# Patient Record
Sex: Male | Born: 1940 | Race: White | Hispanic: No | Marital: Married | Smoking: Former smoker
Health system: Southern US, Community
[De-identification: ages and names within clinical notes are randomized; demographics above are authoritative.]

## PROBLEM LIST (undated history)

## (undated) DIAGNOSIS — I2510 Atherosclerotic heart disease of native coronary artery without angina pectoris: Secondary | ICD-10-CM

## (undated) DIAGNOSIS — C801 Malignant (primary) neoplasm, unspecified: Secondary | ICD-10-CM

## (undated) DIAGNOSIS — I442 Atrioventricular block, complete: Secondary | ICD-10-CM

## (undated) DIAGNOSIS — I10 Essential (primary) hypertension: Secondary | ICD-10-CM

## (undated) HISTORY — PX: CORONARY ARTERY BYPASS GRAFT: SHX141

## (undated) HISTORY — PX: ANKLE SURGERY: SHX546

## (undated) HISTORY — PX: CARDIAC CATHETERIZATION: SHX172

## (undated) HISTORY — PX: EYE SURGERY: SHX253

## (undated) HISTORY — PX: INSERTION PROSTATE RADIATION SEED: SUR718

## (undated) HISTORY — PX: COLONOSCOPY: SHX174

---

## 2008-04-29 ENCOUNTER — Ambulatory Visit: Admission: RE | Admit: 2008-04-29 | Discharge: 2008-07-28 | Payer: Self-pay | Admitting: Radiation Oncology

## 2008-05-27 ENCOUNTER — Encounter: Admission: RE | Admit: 2008-05-27 | Discharge: 2008-05-27 | Payer: Self-pay | Admitting: Urology

## 2008-06-13 ENCOUNTER — Ambulatory Visit (HOSPITAL_BASED_OUTPATIENT_CLINIC_OR_DEPARTMENT_OTHER): Admission: RE | Admit: 2008-06-13 | Discharge: 2008-06-13 | Payer: Self-pay | Admitting: Urology

## 2010-12-31 ENCOUNTER — Emergency Department (HOSPITAL_COMMUNITY)
Admission: EM | Admit: 2010-12-31 | Discharge: 2010-12-31 | Disposition: A | Discharge status: Home / Self Care | Payer: Medicare Other | Attending: Emergency Medicine | Admitting: Emergency Medicine

## 2010-12-31 DIAGNOSIS — N509 Disorder of male genital organs, unspecified: Secondary | ICD-10-CM | POA: Insufficient documentation

## 2010-12-31 DIAGNOSIS — X58XXXA Exposure to other specified factors, initial encounter: Secondary | ICD-10-CM | POA: Insufficient documentation

## 2010-12-31 DIAGNOSIS — IMO0002 Reserved for concepts with insufficient information to code with codable children: Secondary | ICD-10-CM | POA: Insufficient documentation

## 2011-03-01 NOTE — Op Note (Signed)
NAME:  KASSIM, GUERTIN                  ACCOUNT NO.:  [id number]   MEDICAL RECORD NO.:  [id number]          PATIENT TYPE:  AMB   LOCATION:  [facility]                         FACILITY:  [facility]   PHYSICIAN:  Excell Seltzer. Annabell Howells, M.D.    DATE OF BIRTH:  1941-02-07   DATE OF PROCEDURE:  06/13/2008  DATE OF DISCHARGE:                               OPERATIVE REPORT   PROCEDURE:  Prostate seed implantation at cystoscopy.   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   SURGEON:  Dr. Bjorn Pippin.   RADIATION ONCOLOGIST:  Margaretmary Dys.   ANESTHESIA:  General.   DRAINS:  Foley catheter.   COMPLICATIONS:  None.   INDICATIONS:  Quanta is a 70 year old white male with a Gleason 6 T1c/T2a  adenocarcinoma of the prostate with a PSA of 9.37 who has elected to  undergo seed implantation.   FINDINGS OF PROCEDURE:  He was given Cipro.  He was taken to the  operating room where general anesthetic was induced.  He was placed in  lithotomy position.  His genitalia was prepped, and a Foley catheter was  inserted.  Balloon was filled with dilute contrast.  The perineum was  shaved.  Red rubber rectal catheter was placed.  The ultrasound probe  was assembled and inserted, and the Nucletron device was used to do real-  time planning.   Once the real-time plan had been completed, the perineum was prepped  with Betadine solution.  The needle grid was placed, and stabilization  needles were inserted.  The patient was draped in usual sterile fashion.  He then underwent implantation of 82, 125 seeds via 23 needles without  complications.   After completion of the procedure, fluoroscopy revealed a good  distribution of seeds.  There was 1 seed that escaped laterally on the  right, likely in a vein.   The Foley catheter was removed.  The genitalia was reprepped.  Cystoscopy was performed with a 16-French flexible scope.  Examination  revealed a normal urethra.  The external sphincter  was intact.  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  Examination of bladder revealed mild trabeculation.  No tumor, stones or  inflammation were noted.  No seeds were noted in the bladder or the  urethra.  There was minimal bleeding.   At this point, Foley catheter was reinserted.  The patient was  reinserted and placed to straight drainage.  The patient was taken down  from the lithotomy position after his perineum was cleaned and dressed.  His general anesthetic was reversed.  He was taken to recovery room in  stable condition.  There were no complications.      Excell Seltzer. Annabell Howells, M.D.  Electronically Signed     JJW/MEDQ  D:  06/13/2008  T:  06/13/2008  Job:  [id number]   cc:   Artist Pais Kathrynn Running, M.D.  Fax: [phone]

## 2012-06-24 ENCOUNTER — Emergency Department (HOSPITAL_COMMUNITY)
Admission: EM | Admit: 2012-06-24 | Discharge: 2012-06-24 | Disposition: A | Discharge status: Home / Self Care | Payer: Medicare Other | Attending: Emergency Medicine | Admitting: Emergency Medicine

## 2012-06-24 ENCOUNTER — Encounter (HOSPITAL_COMMUNITY): Payer: Self-pay | Admitting: Emergency Medicine

## 2012-06-24 DIAGNOSIS — Z7982 Long term (current) use of aspirin: Secondary | ICD-10-CM | POA: Insufficient documentation

## 2012-06-24 DIAGNOSIS — N390 Urinary tract infection, site not specified: Secondary | ICD-10-CM | POA: Insufficient documentation

## 2012-06-24 DIAGNOSIS — I2510 Atherosclerotic heart disease of native coronary artery without angina pectoris: Secondary | ICD-10-CM | POA: Insufficient documentation

## 2012-06-24 DIAGNOSIS — Z87891 Personal history of nicotine dependence: Secondary | ICD-10-CM | POA: Insufficient documentation

## 2012-06-24 DIAGNOSIS — Z859 Personal history of malignant neoplasm, unspecified: Secondary | ICD-10-CM | POA: Insufficient documentation

## 2012-06-24 DIAGNOSIS — I10 Essential (primary) hypertension: Secondary | ICD-10-CM | POA: Insufficient documentation

## 2012-06-24 DIAGNOSIS — R339 Retention of urine, unspecified: Secondary | ICD-10-CM | POA: Insufficient documentation

## 2012-06-24 HISTORY — DX: Atherosclerotic heart disease of native coronary artery without angina pectoris: I25.10

## 2012-06-24 HISTORY — DX: Essential (primary) hypertension: I10

## 2012-06-24 HISTORY — DX: Malignant (primary) neoplasm, unspecified: C80.1

## 2012-06-24 LAB — URINALYSIS, ROUTINE W REFLEX MICROSCOPIC
Ketones, ur: NEGATIVE mg/dL
Nitrite: NEGATIVE
Specific Gravity, Urine: 1.023 (ref 1.005–1.030)
Urobilinogen, UA: 1 mg/dL (ref 0.0–1.0)
pH: 6.5 (ref 5.0–8.0)

## 2012-06-24 LAB — URINE MICROSCOPIC-ADD ON

## 2012-06-24 MED ORDER — SULFAMETHOXAZOLE-TRIMETHOPRIM 800-160 MG PO TABS: 1.0000 | ORAL_TABLET | Freq: Two times a day (BID) | ORAL | Status: AC

## 2012-06-24 NOTE — ED Notes (Signed)
Pt c/o urinary retention, last urination last night about 9pm. Pt c/o passing bright red blood. Denies pain.

## 2012-06-24 NOTE — ED Provider Notes (Signed)
History     CSN: [id number]  Arrival date & time 06/24/12  0701   First MD Initiated Contact with Patient 06/24/12 [phone]      Chief Complaint  Patient presents with  . Urinary Retention    (Consider location/radiation/quality/duration/timing/severity/associated sxs/prior treatment) The history is provided by the patient.   patient states that he passed a little ago but blood clot last night with urination. He states since then his past bright red blood without being able to urinate otherwise. He states he feels as if he has to go. His history of prostate cancer that is well controlled and has had radiation seeds. No fever. He's been feeling well otherwise. No other bleeding. Dr. Annabell Howells is  his urologist. His PSAs have been normal.  Past Medical History  Diagnosis Date  . Hypertension   . Cancer prostate    with seeds  . Coronary artery disease     Past Surgical History  Procedure Date  . Insertion prostate radiation seed   . Coronary artery bypass graft   . Ankle surgery     No family history on file.  History  Substance Use Topics  . Smoking status: Former Games developer  . Smokeless tobacco: Not on file  . Alcohol Use: Yes     occasional      Review of Systems  Constitutional: Negative for appetite change and fatigue.  Eyes: Negative for pain.  Respiratory: Negative for choking.   Cardiovascular: Negative for chest pain.  Gastrointestinal: Negative for abdominal pain.  Genitourinary: Positive for hematuria, decreased urine volume and difficulty urinating. Negative for discharge, penile swelling, penile pain and testicular pain.  Musculoskeletal: Negative for back pain.  Neurological: Negative for light-headedness.  Hematological: Does not bruise/bleed easily.    Allergies  Review of patient's allergies indicates no known allergies.  Home Medications   Current Outpatient Rx  Name Route Sig Dispense Refill  . AMLODIPINE BESYLATE 5 MG PO TABS Oral Take 5 mg by  mouth daily.    . ASPIRIN 81 MG PO CHEW Oral Chew 81 mg by mouth daily.    Marland Kitchen BIMATOPROST 0.01 % OP SOLN Both Eyes Place 1 drop into both eyes at bedtime.    Marland Kitchen HYDROCHLOROTHIAZIDE 25 MG PO TABS Oral Take 25 mg by mouth daily.    Marland Kitchen METOPROLOL SUCCINATE ER 100 MG PO TB24 Oral Take 100 mg by mouth daily. Take with or immediately following a meal.    . SIMVASTATIN 20 MG PO TABS Oral Take 20 mg by mouth every evening.    Marland Kitchen VALSARTAN 320 MG PO TABS Oral Take 320 mg by mouth daily.    . SULFAMETHOXAZOLE-TRIMETHOPRIM 800-160 MG PO TABS Oral Take 1 tablet by mouth every 12 (twelve) hours. 28 tablet 0    BP 137/87  Pulse 58  Temp 98.2 F (36.8 C) (Oral)  Resp 18  Ht 5\' 9"  (1.753 m)  Wt 185 lb (83.915 kg)  BMI 27.32 kg/m2  SpO2 97%  Physical Exam  Constitutional: He is oriented to person, place, and time. He appears well-developed and well-nourished.  HENT:  Head: Normocephalic.  Eyes: Pupils are equal, round, and reactive to light.  Neck: Normal range of motion.  Cardiovascular: Normal rate and regular rhythm.   Pulmonary/Chest: Effort normal and breath sounds normal.  Abdominal: He exhibits mass. There is tenderness.  Genitourinary: Penis normal. No penile tenderness.  Musculoskeletal: Normal range of motion.  Neurological: He is alert and oriented to person, place, and time.  Skin:  No rash noted.    ED Course  Procedures (including critical care time)  Labs Reviewed  URINALYSIS, ROUTINE W REFLEX MICROSCOPIC - Abnormal; Notable for the following:    Color, Urine RED (*)  BIOCHEMICALS MAY BE AFFECTED BY COLOR   APPearance CLOUDY (*)     Hgb urine dipstick LARGE (*)     Protein, ur 100 (*)     Leukocytes, UA SMALL (*)     All other components within normal limits  URINE MICROSCOPIC-ADD ON - Abnormal; Notable for the following:    Bacteria, UA MANY (*)     All other components within normal limits  URINE CULTURE   No results found.   1. Urinary retention   2. UTI (urinary  tract infection)       MDM  Patient with urinary retention. Improve the Foley catheter. He has apparent UTI on UA. Urine culture has been sent. After discussion with Dr. Vernie Ammons  Patient was started on bactrim and will follow with Dr Annabell Howells.   Juliet Rude. Rubin Payor, MD 06/24/12 [phone]

## 2012-06-24 NOTE — ED Notes (Signed)
[id number]<AV> Expected date:<BR> Expected time:<BR> Means of arrival:<BR> Comments:<BR> t1

## 2012-06-25 LAB — URINE CULTURE: Colony Count: NO GROWTH

## 2013-06-28 ENCOUNTER — Emergency Department (HOSPITAL_COMMUNITY): Payer: Medicare Other

## 2013-06-28 ENCOUNTER — Encounter (HOSPITAL_COMMUNITY): Payer: Self-pay | Admitting: Emergency Medicine

## 2013-06-28 ENCOUNTER — Emergency Department (HOSPITAL_COMMUNITY)
Admission: EM | Admit: 2013-06-28 | Discharge: 2013-06-28 | Disposition: A | Discharge status: Home / Self Care | Payer: Medicare Other | Attending: Emergency Medicine | Admitting: Emergency Medicine

## 2013-06-28 DIAGNOSIS — I10 Essential (primary) hypertension: Secondary | ICD-10-CM | POA: Insufficient documentation

## 2013-06-28 DIAGNOSIS — Z7982 Long term (current) use of aspirin: Secondary | ICD-10-CM | POA: Insufficient documentation

## 2013-06-28 DIAGNOSIS — S52502A Unspecified fracture of the lower end of left radius, initial encounter for closed fracture: Secondary | ICD-10-CM

## 2013-06-28 DIAGNOSIS — Y92410 Unspecified street and highway as the place of occurrence of the external cause: Secondary | ICD-10-CM | POA: Insufficient documentation

## 2013-06-28 DIAGNOSIS — Z87891 Personal history of nicotine dependence: Secondary | ICD-10-CM | POA: Insufficient documentation

## 2013-06-28 DIAGNOSIS — Z79899 Other long term (current) drug therapy: Secondary | ICD-10-CM | POA: Insufficient documentation

## 2013-06-28 DIAGNOSIS — S52599A Other fractures of lower end of unspecified radius, initial encounter for closed fracture: Secondary | ICD-10-CM | POA: Insufficient documentation

## 2013-06-28 DIAGNOSIS — Z8546 Personal history of malignant neoplasm of prostate: Secondary | ICD-10-CM | POA: Insufficient documentation

## 2013-06-28 DIAGNOSIS — Z923 Personal history of irradiation: Secondary | ICD-10-CM | POA: Insufficient documentation

## 2013-06-28 DIAGNOSIS — Z951 Presence of aortocoronary bypass graft: Secondary | ICD-10-CM | POA: Insufficient documentation

## 2013-06-28 DIAGNOSIS — I2510 Atherosclerotic heart disease of native coronary artery without angina pectoris: Secondary | ICD-10-CM | POA: Insufficient documentation

## 2013-06-28 DIAGNOSIS — Y9301 Activity, walking, marching and hiking: Secondary | ICD-10-CM | POA: Insufficient documentation

## 2013-06-28 DIAGNOSIS — R296 Repeated falls: Secondary | ICD-10-CM | POA: Insufficient documentation

## 2013-06-28 MED ORDER — HYDROCODONE-ACETAMINOPHEN 5-325 MG PO TABS: ORAL_TABLET | ORAL | Status: DC

## 2013-06-28 NOTE — ED Notes (Signed)
Pt was wakling down drive way that was lined with [place] flags and went to step and relaized he shouldn't have and tried to jump and fell trying to catch himself with his left hand injuring his left wrist.

## 2013-06-28 NOTE — ED Provider Notes (Signed)
Medical screening examination/treatment/procedure(s) were performed by non-physician practitioner and as supervising physician I was immediately available for consultation/collaboration.   Pt is a 71 y.o. male with Pmhx as above who presents with W radial wrist pain after a fall yesterday.  +ttp disral radius, NVI distally,  No other injuries.  Pt also reports episode of syncope last week & was evaluted at local hospital in [place] at the time.  W/U negative. No syncopal symptoms since.  XR shows comminuted compression fracture of radius. Will place din splint, outpt ortho f/u.   1. Distal radius fracture, left, closed, initial encounter       Shanna Cisco, MD 06/28/13 1148

## 2013-06-28 NOTE — ED Provider Notes (Signed)
CSN: [id number]     Arrival date & time 06/28/13  1037 History   First MD Initiated Contact with Patient 06/28/13 1053     Chief Complaint  Patient presents with  . Wrist Pain  . Wrist Injury    left   (Consider location/radiation/quality/duration/timing/severity/associated sxs/prior Treatment) HPI Pt is a 72yo male c/o gradually worsening left wrist pain and swelling since last night after fall on outstretched hand.  Pt states he was on a walkway trying to step over some American flags, realized he was about to step on one so jumped to the side causing him to fall onto his left hand.   Pain is constant, aching throbbing, 5/10. Worse with palpation and movement. Denies hitting head or LOC.  Denies any other injuries at this time.  Pt is right handed.  Has not taken any pain medication PTA.  Past Medical History  Diagnosis Date  . Hypertension   . Cancer prostate    with seeds  . Coronary artery disease    Past Surgical History  Procedure Laterality Date  . Insertion prostate radiation seed    . Coronary artery bypass graft    . Ankle surgery     No family history on file. History  Substance Use Topics  . Smoking status: Former Games developer  . Smokeless tobacco: Not on file  . Alcohol Use: Yes     Comment: occasional    Review of Systems  Musculoskeletal: Positive for joint swelling and arthralgias.       Left wrist  Skin: Negative for color change, rash and wound.  Neurological: Negative for dizziness, syncope, weakness, light-headedness, numbness and headaches.  All other systems reviewed and are negative.    Allergies  Review of patient's allergies indicates no known allergies.  Home Medications   Current Outpatient Rx  Name  Route  Sig  Dispense  Refill  . amLODipine (NORVASC) 10 MG tablet   Oral   Take 10 mg by mouth daily.         Marland Kitchen aspirin 81 MG chewable tablet   Oral   Chew 81 mg by mouth daily.         Marland Kitchen atorvastatin (LIPITOR) 20 MG tablet   Oral  Take 20 mg by mouth at bedtime.         . bimatoprost (LUMIGAN) 0.01 % SOLN   Ophthalmic   Apply 1 drop to eye at bedtime.         Marland Kitchen doxazosin (CARDURA) 4 MG tablet   Oral   Take 4 mg by mouth daily.         . furosemide (LASIX) 20 MG tablet   Oral   Take 20 mg by mouth daily.         . hydrochlorothiazide (HYDRODIURIL) 25 MG tablet   Oral   Take 25 mg by mouth daily.         . irbesartan (AVAPRO) 300 MG tablet   Oral   Take 300 mg by mouth daily.         . metoprolol succinate (TOPROL-XL) 100 MG 24 hr tablet   Oral   Take 100 mg by mouth daily. Take with or immediately following a meal.         . potassium chloride (KLOR-CON) 20 MEQ packet   Oral   Take 20 mEq by mouth daily.         . valsartan (DIOVAN) 320 MG tablet   Oral   Take 320 mg  by mouth daily.         Marland Kitchen amLODipine (NORVASC) 5 MG tablet   Oral   Take 5 mg by mouth daily.         . bimatoprost (LUMIGAN) 0.01 % SOLN   Both Eyes   Place 1 drop into both eyes at bedtime.         Marland Kitchen HYDROcodone-acetaminophen (NORCO/VICODIN) 5-325 MG per tablet      Take 1-2 pills every 4-6 hours as needed for pain.   6 tablet   0    BP 135/73  Pulse 62  Temp(Src) 98.3 F (36.8 C) (Oral)  Resp 18  Ht 5\' 9"  (1.753 m)  SpO2 93% Physical Exam  Nursing note and vitals reviewed. Constitutional: He is oriented to person, place, and time. He appears well-developed and well-nourished.  HENT:  Head: Normocephalic and atraumatic.  Eyes: EOM are normal.  Neck: Normal range of motion.  Cardiovascular: Normal rate.   Pulmonary/Chest: Effort normal.  Musculoskeletal: He exhibits edema and tenderness.       Left wrist: He exhibits decreased range of motion, tenderness and swelling. He exhibits no crepitus, no deformity and no laceration.       Arms: Moderate edema left wrist, TTP dorsal and radial aspect. Decreased flexion and extension of wrist. 4/5 grip strength. 2+ radial pulse. Cap refill <3sec. Nl  sensation to light touch. Skin in tact. No erythema, warmth, or ecchymosis.  Neurological: He is alert and oriented to person, place, and time.  Skin: Skin is warm and dry.  Psychiatric: He has a normal mood and affect. His behavior is normal.    ED Course  Procedures (including critical care time) Labs Review Labs Reviewed - No data to display Imaging Review Dg Wrist Complete Left  06/28/2013   *RADIOLOGY REPORT*  Clinical Data: Injury.  Pain.  LEFT WRIST - COMPLETE 3+ VIEW  Comparison: None.  Findings: Comminuted impaction fracture of the distal left radius with intra-articular extension with the distal radial articular surface neutral to slight dorsal angulation.  IMPRESSION: Comminuted impaction fracture of the distal left radius with intra- articular extension with the distal radial articular surface neutral to slight dorsal angulation.   Original Report Authenticated By: Lacy Duverney, M.D.    MDM   1. Distal radius fracture, left, closed, initial encounter    Left wrist fracture. Limited ROM of left wrist, 4/5 grip strength limited by pain.  Radial pulse 2+, cap refill <3sec, normal sensation to light touch.  Skin is in tact.  Discussed pt with Dr. Micheline Maze who also examined pt.  He was placed in sugar tong splint, advised to call Dr. Rosaura Carpenter for follow up appointment next week.  Pt also placed in a sling to help keep wrist elevated to decrease swelling.  Rx: norco. Pt info packet on splint care and forearm fractures provided. Pt verbalized understanding and agreement with tx plan.    Junius Finner, PA-C 06/28/13 1222

## 2013-07-03 ENCOUNTER — Encounter (HOSPITAL_COMMUNITY): Payer: Self-pay | Admitting: Pharmacy Technician

## 2013-07-05 ENCOUNTER — Inpatient Hospital Stay (HOSPITAL_COMMUNITY)
Admission: RE | Admit: 2013-07-05 | Discharge: 2013-07-05 | Disposition: A | Discharge status: Home / Self Care | Payer: Medicare Other | Source: Ambulatory Visit

## 2013-07-05 ENCOUNTER — Other Ambulatory Visit: Payer: Self-pay | Admitting: Orthopedic Surgery

## 2013-07-05 ENCOUNTER — Other Ambulatory Visit (HOSPITAL_COMMUNITY): Payer: Self-pay | Admitting: *Deleted

## 2013-07-05 ENCOUNTER — Encounter (HOSPITAL_COMMUNITY): Payer: Self-pay | Admitting: *Deleted

## 2013-07-05 MED ORDER — CEFAZOLIN SODIUM-DEXTROSE 2-3 GM-% IV SOLR
2.0000 g | INTRAVENOUS | Status: AC
  Administered 2013-07-06: 2 g via INTRAVENOUS
  Filled 2013-07-05: qty 50

## 2013-07-05 NOTE — Progress Notes (Signed)
No orders in [facility] for surgery for tomorrow. Called Dr. Carlos Levering office and spoke with Cordelia Pen. She states that his PA is aware.

## 2013-07-06 ENCOUNTER — Observation Stay (HOSPITAL_COMMUNITY)
Admission: RE | Admit: 2013-07-06 | Discharge: 2013-07-07 | Disposition: A | Discharge status: Home / Self Care | Payer: Medicare Other | Source: Ambulatory Visit | Attending: Orthopedic Surgery | Admitting: Orthopedic Surgery

## 2013-07-06 ENCOUNTER — Encounter (HOSPITAL_COMMUNITY): Payer: Self-pay | Admitting: *Deleted

## 2013-07-06 ENCOUNTER — Ambulatory Visit (HOSPITAL_COMMUNITY): Payer: Medicare Other | Admitting: Anesthesiology

## 2013-07-06 ENCOUNTER — Encounter (HOSPITAL_COMMUNITY)
Admission: RE | Disposition: A | Discharge status: Home / Self Care | Payer: Self-pay | Source: Ambulatory Visit | Attending: Orthopedic Surgery

## 2013-07-06 ENCOUNTER — Encounter (HOSPITAL_COMMUNITY): Payer: Self-pay | Admitting: Anesthesiology

## 2013-07-06 ENCOUNTER — Ambulatory Visit (HOSPITAL_COMMUNITY): Payer: Medicare Other

## 2013-07-06 DIAGNOSIS — X58XXXA Exposure to other specified factors, initial encounter: Secondary | ICD-10-CM | POA: Insufficient documentation

## 2013-07-06 DIAGNOSIS — S5292XA Unspecified fracture of left forearm, initial encounter for closed fracture: Secondary | ICD-10-CM

## 2013-07-06 DIAGNOSIS — I10 Essential (primary) hypertension: Secondary | ICD-10-CM | POA: Insufficient documentation

## 2013-07-06 DIAGNOSIS — Z79899 Other long term (current) drug therapy: Secondary | ICD-10-CM | POA: Insufficient documentation

## 2013-07-06 DIAGNOSIS — S52599A Other fractures of lower end of unspecified radius, initial encounter for closed fracture: Principal | ICD-10-CM | POA: Insufficient documentation

## 2013-07-06 HISTORY — PX: OPEN REDUCTION INTERNAL FIXATION (ORIF) DISTAL RADIAL FRACTURE: SHX5989

## 2013-07-06 LAB — CBC
HCT: 37.6 % — ABNORMAL LOW (ref 39.0–52.0)
MCH: 32.6 pg (ref 26.0–34.0)
MCHC: 35.9 g/dL (ref 30.0–36.0)
MCV: 90.8 fL (ref 78.0–100.0)
Platelets: 229 10*3/uL (ref 150–400)
RDW: 13.1 % (ref 11.5–15.5)
WBC: 8.3 10*3/uL (ref 4.0–10.5)

## 2013-07-06 LAB — BASIC METABOLIC PANEL
Calcium: 9 mg/dL (ref 8.4–10.5)
Creatinine, Ser: 0.63 mg/dL (ref 0.50–1.35)
GFR calc Af Amer: >90 mL/min (ref >90)
GFR calc non Af Amer: >90 mL/min (ref >90)

## 2013-07-06 SURGERY — OPEN REDUCTION INTERNAL FIXATION (ORIF) DISTAL RADIUS FRACTURE
Anesthesia: General | Site: Wrist | Laterality: Left | Wound class: Clean

## 2013-07-06 MED ORDER — HYDROCHLOROTHIAZIDE 25 MG PO TABS
25.0000 mg | ORAL_TABLET | Freq: Every day | ORAL | Status: DC
  Administered 2013-07-06 – 2013-07-07 (×2): 25 mg via ORAL
  Filled 2013-07-06 (×2): qty 1

## 2013-07-06 MED ORDER — ATORVASTATIN CALCIUM 20 MG PO TABS
20.0000 mg | ORAL_TABLET | Freq: Every day | ORAL | Status: DC
  Administered 2013-07-06: 20 mg via ORAL
  Filled 2013-07-06 (×2): qty 1

## 2013-07-06 MED ORDER — 0.9 % SODIUM CHLORIDE (POUR BTL) OPTIME
TOPICAL | Status: DC | PRN
  Administered 2013-07-06: 1000 mL

## 2013-07-06 MED ORDER — ONDANSETRON HCL 4 MG/2ML IJ SOLN: 4.0000 mg | Freq: Four times a day (QID) | INTRAMUSCULAR | Status: DC | PRN

## 2013-07-06 MED ORDER — LACTATED RINGERS IV SOLN
INTRAVENOUS | Status: DC
  Administered 2013-07-06: 08:00:00 via INTRAVENOUS

## 2013-07-06 MED ORDER — CEFAZOLIN SODIUM 1-5 GM-% IV SOLN: 1.0000 g | INTRAVENOUS | Status: DC

## 2013-07-06 MED ORDER — DOCUSATE SODIUM 100 MG PO CAPS
100.0000 mg | ORAL_CAPSULE | Freq: Two times a day (BID) | ORAL | Status: DC
  Administered 2013-07-06 – 2013-07-07 (×2): 100 mg via ORAL
  Filled 2013-07-06 (×3): qty 1

## 2013-07-06 MED ORDER — FENTANYL CITRATE 0.05 MG/ML IJ SOLN
INTRAMUSCULAR | Status: DC | PRN
  Administered 2013-07-06 (×3): 50 ug via INTRAVENOUS

## 2013-07-06 MED ORDER — MIDAZOLAM HCL 2 MG/2ML IJ SOLN
2.0000 mg | Freq: Once | INTRAMUSCULAR | Status: AC
  Administered 2013-07-06: 2 mg via INTRAVENOUS

## 2013-07-06 MED ORDER — POTASSIUM CHLORIDE CRYS ER 20 MEQ PO TBCR
20.0000 meq | EXTENDED_RELEASE_TABLET | Freq: Every day | ORAL | Status: DC
  Administered 2013-07-06 – 2013-07-07 (×2): 20 meq via ORAL
  Filled 2013-07-06 (×2): qty 1

## 2013-07-06 MED ORDER — DOXAZOSIN MESYLATE 4 MG PO TABS
4.0000 mg | ORAL_TABLET | Freq: Every day | ORAL | Status: DC
  Administered 2013-07-07: 4 mg via ORAL
  Filled 2013-07-06: qty 1

## 2013-07-06 MED ORDER — KETOROLAC TROMETHAMINE 30 MG/ML IJ SOLN
15.0000 mg | Freq: Once | INTRAMUSCULAR | Status: AC | PRN
  Administered 2013-07-06: 30 mg via INTRAVENOUS

## 2013-07-06 MED ORDER — PANTOPRAZOLE SODIUM 40 MG PO TBEC: 40.0000 mg | DELAYED_RELEASE_TABLET | Freq: Two times a day (BID) | ORAL | Status: DC | PRN

## 2013-07-06 MED ORDER — IRBESARTAN 300 MG PO TABS
300.0000 mg | ORAL_TABLET | Freq: Every day | ORAL | Status: DC
  Administered 2013-07-06 – 2013-07-07 (×2): 300 mg via ORAL
  Filled 2013-07-06 (×2): qty 1

## 2013-07-06 MED ORDER — ADULT MULTIVITAMIN W/MINERALS CH
1.0000 | ORAL_TABLET | Freq: Every day | ORAL | Status: DC
  Administered 2013-07-07: 1 via ORAL
  Filled 2013-07-06: qty 1

## 2013-07-06 MED ORDER — HYDROMORPHONE HCL PF 1 MG/ML IJ SOLN
0.2500 mg | INTRAMUSCULAR | Status: DC | PRN
  Administered 2013-07-06 (×2): 0.5 mg via INTRAVENOUS

## 2013-07-06 MED ORDER — ATROPINE SULFATE 1 MG/ML IJ SOLN
INTRAMUSCULAR | Status: DC | PRN
  Administered 2013-07-06: .6 mg via INTRAVENOUS

## 2013-07-06 MED ORDER — PROPOFOL 10 MG/ML IV BOLUS
INTRAVENOUS | Status: DC | PRN
  Administered 2013-07-06: 200 mg via INTRAVENOUS

## 2013-07-06 MED ORDER — OXYCODONE HCL 5 MG PO TABS
5.0000 mg | ORAL_TABLET | ORAL | Status: DC | PRN
  Administered 2013-07-06: 5 mg via ORAL
  Administered 2013-07-06 – 2013-07-07 (×3): 10 mg via ORAL
  Filled 2013-07-06 (×2): qty 1
  Filled 2013-07-06: qty 2
  Filled 2013-07-06: qty 1

## 2013-07-06 MED ORDER — ONDANSETRON HCL 4 MG/2ML IJ SOLN: 4.0000 mg | Freq: Once | INTRAMUSCULAR | Status: DC | PRN

## 2013-07-06 MED ORDER — KETOROLAC TROMETHAMINE 30 MG/ML IJ SOLN
INTRAMUSCULAR | Status: AC
  Filled 2013-07-06: qty 1

## 2013-07-06 MED ORDER — AMLODIPINE BESYLATE 10 MG PO TABS
10.0000 mg | ORAL_TABLET | Freq: Every day | ORAL | Status: DC
  Administered 2013-07-07: 10 mg via ORAL
  Filled 2013-07-06: qty 1

## 2013-07-06 MED ORDER — FENTANYL CITRATE 0.05 MG/ML IJ SOLN
100.0000 ug | Freq: Once | INTRAMUSCULAR | Status: AC
  Administered 2013-07-06: 100 ug via INTRAVENOUS

## 2013-07-06 MED ORDER — FENTANYL CITRATE 0.05 MG/ML IJ SOLN
INTRAMUSCULAR | Status: AC
  Administered 2013-07-06: 100 ug via INTRAVENOUS
  Filled 2013-07-06: qty 2

## 2013-07-06 MED ORDER — LACTATED RINGERS IV SOLN
INTRAVENOUS | Status: DC
  Administered 2013-07-06 – 2013-07-07 (×2): via INTRAVENOUS

## 2013-07-06 MED ORDER — FUROSEMIDE 20 MG PO TABS
20.0000 mg | ORAL_TABLET | Freq: Every day | ORAL | Status: DC
  Administered 2013-07-06 – 2013-07-07 (×2): 20 mg via ORAL
  Filled 2013-07-06 (×2): qty 1

## 2013-07-06 MED ORDER — METHOCARBAMOL 500 MG PO TABS
ORAL_TABLET | ORAL | Status: AC
  Filled 2013-07-06: qty 1

## 2013-07-06 MED ORDER — EPHEDRINE SULFATE 50 MG/ML IJ SOLN
INTRAMUSCULAR | Status: DC | PRN
  Administered 2013-07-06 (×3): 15 mg via INTRAVENOUS

## 2013-07-06 MED ORDER — HYDROMORPHONE HCL PF 1 MG/ML IJ SOLN
INTRAMUSCULAR | Status: AC
  Filled 2013-07-06: qty 1

## 2013-07-06 MED ORDER — LIDOCAINE HCL (CARDIAC) 20 MG/ML IV SOLN
INTRAVENOUS | Status: DC | PRN
  Administered 2013-07-06: 50 mg via INTRAVENOUS

## 2013-07-06 MED ORDER — ONDANSETRON HCL 4 MG PO TABS: 4.0000 mg | ORAL_TABLET | Freq: Four times a day (QID) | ORAL | Status: DC | PRN

## 2013-07-06 MED ORDER — CEFAZOLIN SODIUM 1-5 GM-% IV SOLN
1.0000 g | Freq: Three times a day (TID) | INTRAVENOUS | Status: DC
  Administered 2013-07-06 – 2013-07-07 (×3): 1 g via INTRAVENOUS
  Filled 2013-07-06 (×5): qty 50

## 2013-07-06 MED ORDER — ALPRAZOLAM 0.5 MG PO TABS: 0.5000 mg | ORAL_TABLET | Freq: Four times a day (QID) | ORAL | Status: DC | PRN

## 2013-07-06 MED ORDER — LACTATED RINGERS IV SOLN
INTRAVENOUS | Status: DC | PRN
  Administered 2013-07-06 (×2): via INTRAVENOUS

## 2013-07-06 MED ORDER — TEMAZEPAM 15 MG PO CAPS: 15.0000 mg | ORAL_CAPSULE | Freq: Every evening | ORAL | Status: DC | PRN

## 2013-07-06 MED ORDER — LATANOPROST 0.005 % OP SOLN
1.0000 [drp] | Freq: Every day | OPHTHALMIC | Status: DC
  Administered 2013-07-06: 1 [drp] via OPHTHALMIC
  Filled 2013-07-06: qty 2.5

## 2013-07-06 MED ORDER — METHOCARBAMOL 500 MG PO TABS
500.0000 mg | ORAL_TABLET | Freq: Four times a day (QID) | ORAL | Status: DC | PRN
  Administered 2013-07-06: 500 mg via ORAL

## 2013-07-06 MED ORDER — OXYCODONE HCL 5 MG PO TABS
ORAL_TABLET | ORAL | Status: AC
  Filled 2013-07-06: qty 2

## 2013-07-06 MED ORDER — CHLORHEXIDINE GLUCONATE 4 % EX LIQD: 60.0000 mL | Freq: Once | CUTANEOUS | Status: DC

## 2013-07-06 MED ORDER — MIDAZOLAM HCL 2 MG/2ML IJ SOLN
INTRAMUSCULAR | Status: AC
  Administered 2013-07-06: 2 mg via INTRAVENOUS
  Filled 2013-07-06: qty 2

## 2013-07-06 MED ORDER — METHOCARBAMOL 100 MG/ML IJ SOLN
500.0000 mg | Freq: Four times a day (QID) | INTRAVENOUS | Status: DC | PRN
  Filled 2013-07-06: qty 5

## 2013-07-06 MED ORDER — MORPHINE SULFATE 2 MG/ML IJ SOLN: 1.0000 mg | INTRAMUSCULAR | Status: DC | PRN

## 2013-07-06 MED ORDER — VITAMIN C 500 MG PO TABS
1000.0000 mg | ORAL_TABLET | Freq: Every day | ORAL | Status: DC
  Administered 2013-07-06 – 2013-07-07 (×2): 1000 mg via ORAL
  Filled 2013-07-06 (×2): qty 2

## 2013-07-06 MED ORDER — METOPROLOL SUCCINATE ER 100 MG PO TB24
100.0000 mg | ORAL_TABLET | Freq: Every day | ORAL | Status: DC
  Administered 2013-07-07: 100 mg via ORAL
  Filled 2013-07-06: qty 1

## 2013-07-06 SURGICAL SUPPLY — 58 items
BANDAGE ELASTIC 3 VELCRO ST LF (GAUZE/BANDAGES/DRESSINGS) ×2 IMPLANT
BANDAGE ELASTIC 4 VELCRO ST LF (GAUZE/BANDAGES/DRESSINGS) ×2 IMPLANT
BANDAGE GAUZE ELAST BULKY 4 IN (GAUZE/BANDAGES/DRESSINGS) ×2 IMPLANT
BIT DRILL 2.2 SS TIBIAL (BIT) ×2 IMPLANT
BLADE SURG ROTATE 9660 (MISCELLANEOUS) IMPLANT
BNDG ESMARK 4X9 LF (GAUZE/BANDAGES/DRESSINGS) ×2 IMPLANT
CLOTH BEACON ORANGE TIMEOUT ST (SAFETY) ×2 IMPLANT
CORDS BIPOLAR (ELECTRODE) ×2 IMPLANT
COVER SURGICAL LIGHT HANDLE (MISCELLANEOUS) ×2 IMPLANT
CUFF TOURNIQUET SINGLE 18IN (TOURNIQUET CUFF) ×2 IMPLANT
CUFF TOURNIQUET SINGLE 24IN (TOURNIQUET CUFF) IMPLANT
DECANTER SPIKE VIAL GLASS SM (MISCELLANEOUS) IMPLANT
DRAIN TLS ROUND 10FR (DRAIN) IMPLANT
DRAPE OEC MINIVIEW 54X84 (DRAPES) ×2 IMPLANT
DRAPE U-SHAPE 47X51 STRL (DRAPES) ×2 IMPLANT
GAUZE XEROFORM 1X8 LF (GAUZE/BANDAGES/DRESSINGS) ×2 IMPLANT
GLOVE ORTHO TXT STRL SZ7.5 (GLOVE) ×2 IMPLANT
GLOVE SS BIOGEL STRL SZ 8 (GLOVE) ×1 IMPLANT
GLOVE SUPERSENSE BIOGEL SZ 8 (GLOVE) ×1
GOWN PREVENTION PLUS XLARGE (GOWN DISPOSABLE) IMPLANT
GOWN STRL NON-REIN LRG LVL3 (GOWN DISPOSABLE) ×4 IMPLANT
GOWN STRL REIN XL XLG (GOWN DISPOSABLE) ×2 IMPLANT
KIT BASIN OR (CUSTOM PROCEDURE TRAY) ×2 IMPLANT
KIT ROOM TURNOVER OR (KITS) ×2 IMPLANT
LOOP VESSEL MAXI BLUE (MISCELLANEOUS) IMPLANT
MANIFOLD NEPTUNE II (INSTRUMENTS) IMPLANT
NEEDLE 22X1 1/2 (OR ONLY) (NEEDLE) ×2 IMPLANT
NS IRRIG 1000ML POUR BTL (IV SOLUTION) ×2 IMPLANT
PACK ORTHO EXTREMITY (CUSTOM PROCEDURE TRAY) ×2 IMPLANT
PAD ARMBOARD 7.5X6 YLW CONV (MISCELLANEOUS) ×4 IMPLANT
PAD CAST 4YDX4 CTTN HI CHSV (CAST SUPPLIES) ×1 IMPLANT
PADDING CAST ABS 4INX4YD NS (CAST SUPPLIES) ×2
PADDING CAST ABS COTTON 4X4 ST (CAST SUPPLIES) ×2 IMPLANT
PADDING CAST COTTON 4X4 STRL (CAST SUPPLIES) ×1
PEG LOCKING SMOOTH 2.2X18 (Peg) ×2 IMPLANT
PEG LOCKING SMOOTH 2.2X20 (Screw) ×4 IMPLANT
PEG LOCKING SMOOTH 2.2X22 (Screw) ×8 IMPLANT
PLATE NARROW DVR LEFT (Plate) ×2 IMPLANT
SCREW LOCK 14X2.7X 3 LD TPR (Screw) ×1 IMPLANT
SCREW LOCK 16X2.7X 3 LD TPR (Screw) ×1 IMPLANT
SCREW LOCKING 2.7X14 (Screw) ×1 IMPLANT
SCREW LOCKING 2.7X15MM (Screw) ×2 IMPLANT
SCREW LOCKING 2.7X16 (Screw) ×1 IMPLANT
SPLINT FIBERGLASS 3X35 (CAST SUPPLIES) ×2 IMPLANT
SPONGE GAUZE 4X4 12PLY (GAUZE/BANDAGES/DRESSINGS) ×2 IMPLANT
SPONGE LAP 4X18 X RAY DECT (DISPOSABLE) IMPLANT
SUT MNCRL AB 4-0 PS2 18 (SUTURE) ×2 IMPLANT
SUT PROLENE 3 0 PS 2 (SUTURE) IMPLANT
SUT PROLENE 4 0 PS 2 18 (SUTURE) ×4 IMPLANT
SUT VIC AB 3-0 FS2 27 (SUTURE) ×4 IMPLANT
SYR CONTROL 10ML LL (SYRINGE) ×2 IMPLANT
SYSTEM CHEST DRAIN TLS 7FR (DRAIN) IMPLANT
TOWEL OR 17X24 6PK STRL BLUE (TOWEL DISPOSABLE) ×2 IMPLANT
TOWEL OR 17X26 10 PK STRL BLUE (TOWEL DISPOSABLE) ×2 IMPLANT
TUBE CONNECTING 12X1/4 (SUCTIONS) ×2 IMPLANT
TUBE EVACUATION TLS (MISCELLANEOUS) IMPLANT
UNDERPAD 30X30 INCONTINENT (UNDERPADS AND DIAPERS) ×2 IMPLANT
WATER STERILE IRR 1000ML POUR (IV SOLUTION) IMPLANT

## 2013-07-06 NOTE — Op Note (Signed)
NAMEMarland Kitchen  Alvin Simpson NO.:  [id number]  MEDICAL RECORD NO.:  [id number]  LOCATION:  [facility]                         FACILITY:  [facility]  PHYSICIAN:  Dionne Ano. Conswella Bruney, M.D.DATE OF BIRTH:  [birth year]  DATE OF PROCEDURE:  07/06/2013 DATE OF DISCHARGE:                              OPERATIVE REPORT   PREOPERATIVE DIAGNOSIS:  Comminuted complex greater than 3-part intra- articular distal radius fracture, left upper extremity.  POSTOPERATIVE DIAGNOSIS:  Comminuted complex greater than 3-part intra- articular distal radius fracture, left upper extremity.  SURGICAL PROCEDURE PERFORMED: 1. Open reduction and internal fixation, distal radius fracture, left     upper extremity with DVR standard plate and screw construct 2. Stress radiography.  SURGEON:  Dionne Ano. Amanda Pea, MD  ASSISTANT:  Karie Chimera, PA-C  COMPLICATION:  None.  ANESTHESIA:  General with preoperative block.  TOURNIQUET TIME:  Less than an hour.  DRAINS:  One.  INDICATIONS:  Alvin Simpson, who presents with the above-mentioned diagnosis, 72 years of age.  He has had preop clearance by Cardiology. He understands risks and benefits and desires to proceed.  OPERATION IN DETAIL:  The patient was seen by myself and anesthesia. Taken to the operative suite, underwent smooth induction of general anesthesia.  Preoperative block was placed, preoperative antibiotics were given.  Time-out was called.  Pre and postop check was complete. Lower extremities were well padded.  Following this, the patient underwent thorough prep and drape with Betadine scrub and paint. Following this, the patient underwent tourniquet insufflation.  Volar radial incision was made.  Dissection was carried down and the patient then underwent FCR tendon sheath incision palmarly and dorsally.  Carpal canal contents were retracted ulnarly.  FCR retracted radially. Pronator incised in L-shaped fashion and following this the anatomy  was reconstructed with combination of elevator and orthopedic instrument.  I was able to achieve adequate radial height, inclination of volar tilt. Following this, performed placement of a DVR plate and screw construct. This was placed in standard AO technique.  Following this, we x-rayed the patient under live fluoro and demonstrated excellent range of motion, good stability and adequate radial inclination, volar tilt and radial height.  I was pleased with the findings.  I felt the recreation of anatomy looked excellent.  I repaired the pronator after irrigation.  Following this we stress tested the distal radioulnar joint, which looked excellent.  Following this, under live fluoro, the radiocarpal, mid carpal, and distal radioulnar joints all looked quite well with good stability and no fracture displacement and I felt good about our fixation.  At this time, we irrigated copiously once again, placed a TLS drain and closed the wound with Prolene.  The patient will be admitted for postop observation, IV antibiotics, general postop measures.  We will proceed according to our standard protocol.  Sutures out at 12 days, from weeks 2-4 cast with elevation of finger, range of motion from weeks 4 through 8.  We will go ahead and begin active range of motion with interval splinting in our therapy department and at greater than 8 weeks postop, we will begin strengthening if all is looking well.  These notes have been  discussed and all questions have been encouraged and answered.  It was an absolute pleasure to see him today and participate in his care.     Dionne Ano. Amanda Pea, M.D.     [facility]  D:  07/06/2013  T:  07/06/2013  Job:  [id number]

## 2013-07-06 NOTE — Anesthesia Postprocedure Evaluation (Signed)
  Anesthesia Post-op Note  Patient: Alvin Simpson  Procedure(s) Performed: Procedure(s): OPEN REDUCTION INTERNAL FIXATION (ORIF) LEFT DISTAL RADIAL FRACTURE (Left)  Patient Location: PACU  Anesthesia Type:General  Level of Consciousness: awake, alert  and oriented  Airway and Oxygen Therapy: Patient Spontanous Breathing  Post-op Pain: none  Post-op Assessment: Post-op Vital signs reviewed, Patient's Cardiovascular Status Stable, Respiratory Function Stable, Patent Airway and Pain level controlled  Post-op Vital Signs: stable  Complications: No apparent anesthesia complications

## 2013-07-06 NOTE — Transfer of Care (Signed)
Immediate Anesthesia Transfer of Care Note  Patient: Alvin Simpson  Procedure(s) Performed: Procedure(s): OPEN REDUCTION INTERNAL FIXATION (ORIF) LEFT DISTAL RADIAL FRACTURE (Left)  Patient Location: PACU  Anesthesia Type:General and Regional  Level of Consciousness: sedated  Airway & Oxygen Therapy: Patient Spontanous Breathing and Patient connected to nasal cannula oxygen  Post-op Assessment: Report given to PACU RN and Post -op Vital signs reviewed and stable  Post vital signs: Reviewed and stable  Complications: No apparent anesthesia complications

## 2013-07-06 NOTE — H&P (Signed)
Alvin Simpson is an 72 y.o. male.   Chief Complaint: left radius fracture HPI: presents for ORIF left radius fx. Marland Kitchen.Patient presents for evaluation and treatment of the of their upper extremity predicament. The patient denies neck back chest or of abdominal pain. The patient notes that they have no lower extremity problems. The patient from primarily complains of the upper extremity pain noted.  Past Medical History  Diagnosis Date  . Hypertension   . Cancer prostate    with seeds  . Coronary artery disease     Past Surgical History  Procedure Laterality Date  . Insertion prostate radiation seed    . Ankle surgery    . Coronary artery bypass graft      2  . Cardiac catheterization      18 years ago  . Eye surgery Right     cataract  . Colonoscopy      History reviewed. No pertinent family history. Social History:  reports that he quit smoking about 48 years ago. He has never used smokeless tobacco. He reports that  drinks alcohol. He reports that he does not use illicit drugs.  Allergies: No Known Allergies  Medications Prior to Admission  Medication Sig Dispense Refill  . amLODipine (NORVASC) 10 MG tablet Take 10 mg by mouth daily.      Marland Kitchen aspirin 81 MG chewable tablet Chew 81 mg by mouth daily.      Marland Kitchen atorvastatin (LIPITOR) 20 MG tablet Take 20 mg by mouth daily.       . bimatoprost (LUMIGAN) 0.01 % SOLN Apply 1 drop to eye at bedtime.      Marland Kitchen doxazosin (CARDURA) 4 MG tablet Take 4 mg by mouth daily.      . furosemide (LASIX) 20 MG tablet Take 20 mg by mouth daily.      . hydrochlorothiazide (HYDRODIURIL) 25 MG tablet Take 25 mg by mouth daily.      . irbesartan (AVAPRO) 300 MG tablet Take 300 mg by mouth daily.      . metoprolol succinate (TOPROL-XL) 100 MG 24 hr tablet Take 100 mg by mouth daily. Take with or immediately following a meal.      . potassium chloride SA (K-DUR,KLOR-CON) 20 MEQ tablet Take 20 mEq by mouth daily.        Results for orders placed during the  hospital encounter of 07/06/13 (from the past 48 hour(s))  BASIC METABOLIC PANEL     Status: Abnormal   Collection Time    07/06/13  7:15 AM      Result Value Range   Sodium 137  135 - 145 mEq/L   Potassium 3.0 (*) 3.5 - 5.1 mEq/L   Chloride 100  96 - 112 mEq/L   CO2 26  19 - 32 mEq/L   Glucose, Bld 127 (*) 70 - 99 mg/dL   BUN 16  6 - 23 mg/dL   Creatinine, Ser 1.61  0.50 - 1.35 mg/dL   Calcium 9.0  8.4 - 09.6 mg/dL   GFR calc non Af Amer >90  >90 mL/min   GFR calc Af Amer >90  >90 mL/min   Comment: (NOTE)     The eGFR has been calculated using the CKD EPI equation.     This calculation has not been validated in all clinical situations.     eGFR's persistently <90 mL/min signify possible Chronic Kidney     Disease.  CBC     Status: Abnormal   Collection Time  07/06/13  7:15 AM      Result Value Range   WBC 8.3  4.0 - 10.5 K/uL   RBC 4.14 (*) 4.22 - 5.81 MIL/uL   Hemoglobin 13.5  13.0 - 17.0 g/dL   HCT 16.1 (*) 09.6 - 04.5 %   MCV 90.8  78.0 - 100.0 fL   MCH 32.6  26.0 - 34.0 pg   MCHC 35.9  30.0 - 36.0 g/dL   RDW 40.9  81.1 - 91.4 %   Platelets 229  150 - 400 K/uL   Dg Chest 2 View  07/06/2013   CLINICAL DATA:  Hypertension.  EXAM: CHEST  2 VIEW  COMPARISON:  05/27/2008  FINDINGS: Prior CABG noted with stable mild cardiomegaly but without edema. Low lung volumes lead to vascular crowding. No pleural effusion.  IMPRESSION: *Stable mild cardiomegaly, without edema. No acute findings.   Electronically Signed   By: Herbie Baltimore   On: 07/06/2013 08:49    Review of Systems  Constitutional: Negative.   HENT: Negative.   Eyes: Negative.   Respiratory: Negative.   Cardiovascular: Negative.   Gastrointestinal: Negative.   Genitourinary: Negative.   Skin: Negative.   Neurological: Negative.   Endo/Heme/Allergies: Negative.   Psychiatric/Behavioral: Negative.     Blood pressure 122/62, pulse 57, temperature 97.4 F (36.3 C), temperature source Oral, resp. rate 24, SpO2  94.00%. Physical Exam left radius fx displaced. NVI .Marland KitchenThe patient is alert and oriented in no acute distress the patient complains of pain in the affected upper extremity.  The patient is noted to have a normal HEENT exam.  Lung fields show equal chest expansion and no shortness of breath  abdomen exam is nontender without distention.  Lower extremity examination does not show any fracture dislocation or blood clot symptoms.  Pelvis is stable neck and back are stable and nontender  Assessment/Plan .Marland KitchenWe are planning surgery for your upper extremity. The risk and benefits of surgery include risk of bleeding infection anesthesia damage to normal structures and failure of the surgery to accomplish its intended goals of relieving symptoms and restoring function with this in mind we'll going to proceed. I have specifically discussed with the patient the pre-and postoperative regime and the does and don'ts and risk and benefits in great detail. Risk and benefits of surgery also include risk of dystrophy chronic nerve pain failure of the healing process to go onto completion and other inherent risks of surgery The relavent the pathophysiology of the disease/injury process, as well as the alternatives for treatment and postoperative course of action has been discussed in great detail with the patient who desires to proceed.  We will do everything in our power to help you (the patient) restore function to the upper extremity. Is a pleasure to see this patient today.   Karen Chafe 07/06/2013, 9:44 AM

## 2013-07-06 NOTE — Anesthesia Procedure Notes (Addendum)
Procedure Name: LMA Insertion Date/Time: 07/06/2013 10:07 AM Performed by: Alanda Amass A Pre-anesthesia Checklist: Patient identified, Timeout performed, Emergency Drugs available, Suction available and Patient being monitored Patient Re-evaluated:Patient Re-evaluated prior to inductionOxygen Delivery Method: Circle system utilized Preoxygenation: Pre-oxygenation with 100% oxygen Intubation Type: IV induction LMA Size: 5.0 Number of attempts: 1 Placement Confirmation: positive ETCO2 and breath sounds checked- equal and bilateral Tube secured with: Tape Dental Injury: Teeth and Oropharynx as per pre-operative assessment    Anesthesia Regional Block:  Supraclavicular block  Pre-Anesthetic Checklist: ,, timeout performed, Correct Patient, Correct Site, Correct Laterality, Correct Procedure, Correct Position, site marked, Risks and benefits discussed, Surgical consent,  Pre-op evaluation,  Post-op pain management  Laterality: Left  Prep: chloraprep       Needles:  Injection technique: Single-shot  Needle Type: Echogenic Stimulator Needle     Needle Length:cm 9 cm Needle Gauge: 22 and 22 G    Additional Needles:  Procedures: ultrasound guided (picture in chart) and nerve stimulator Supraclavicular block Narrative:  Start time: 07/06/2013 9:45 AM End time: 07/06/2013 9:55 AM Injection made incrementally with aspirations every 5 mL.  Performed by: Personally   Additional Notes: 25 cc 0.5% bupivacaine with 1:200 Epi injected easily

## 2013-07-06 NOTE — Progress Notes (Signed)
Timeout completed prior to left intrascalene block

## 2013-07-06 NOTE — Op Note (Signed)
See Dictation #[id number] Wendall Stade MD

## 2013-07-06 NOTE — Anesthesia Preprocedure Evaluation (Addendum)
Anesthesia Evaluation  Patient identified by MRN, date of birth, ID band Patient awake    Reviewed: Allergy & Precautions, H&P , NPO status , Patient's Chart, lab work & pertinent test results, reviewed documented beta blocker date and time   Airway Mallampati: II TM Distance: >3 FB Neck ROM: Full    Dental  (+) Teeth Intact and Dental Advisory Given   Pulmonary  breath sounds clear to auscultation        Cardiovascular hypertension, Pt. on medications and Pt. on home beta blockers + CAD and + CABG Rhythm:Regular Rate:Normal     Neuro/Psych    GI/Hepatic   Endo/Other    Renal/GU      Musculoskeletal   Abdominal   Peds  Hematology   Anesthesia Other Findings   Reproductive/Obstetrics                          Anesthesia Physical Anesthesia Plan  ASA: III  Anesthesia Plan: General   Post-op Pain Management:    Induction: Intravenous  Airway Management Planned: LMA  Additional Equipment:   Intra-op Plan:   Post-operative Plan:   Informed Consent: I have reviewed the patients History and Physical, chart, labs and discussed the procedure including the risks, benefits and alternatives for the proposed anesthesia with the patient or authorized representative who has indicated his/her understanding and acceptance.   Dental advisory given  Plan Discussed with: CRNA and Anesthesiologist  Anesthesia Plan Comments: (L. Distal radius fracture CAD S/P CABG 1996 no angina Htn Near syncopal episode 06/22/13, stress test last week (-) per patient  Kipp Brood, MD)       Anesthesia Quick Evaluation

## 2013-07-06 NOTE — Preoperative (Signed)
Beta Blockers   Reason not to administer Beta Blockers:Pt. took metoprolol 0600, 07/06/2013

## 2013-07-07 NOTE — Evaluation (Signed)
Occupational Therapy Evaluation Patient Details Name: Alvin Simpson MRN: [id number] DOB: [birth year] Today's Date: 07/07/2013 Time: 7829-5621 OT Time Calculation (min): 14 min  OT Assessment / Plan / Recommendation History of present illness s/p ORIF left distal radial fx   Clinical Impression   Pt admitted with above.  Educated pt on elevation, edema control, and ADL techniques and pt demonstrated understanding.  Pt will have wife at home to assist as needed.  No further acute OT needed.  Recommend progress rehab of LUE as ordered by MD at f/u visit.  Signing off.    OT Assessment   (progress rehab of L UE as ordered by MD)    Follow Up Recommendations  Supervision - Intermittent    Barriers to Discharge      Equipment Recommendations  None recommended by OT    Recommendations for Other Services    Frequency       Precautions / Restrictions Precautions Precaution Comments: elevation and edema control   Pertinent Vitals/Pain L UE 7/10 pain. RN made aware.    ADL  Eating/Feeding: Performed;Modified independent Where Assessed - Eating/Feeding: Bed level ADL Comments: Educated pt on keeping LUE elevated when in bed or chair on 2-3 pillows for edema control.  Also educated on use of ice pack for assisting with pain management and edema control.  Pt independent in performing digit and shoulder AROM.  States he and his wife have been managing well at home since he fell and obtained left radial fx on 9/11.  Educated pt to thread LUE through sleeve first when donning shirt, and reverse process when doffing.  Pt states he has been up ambulating since sx and has had no difficulty and that he does not typically experience falls (fall on 9/11 was first one due to trying to avoid stepping on a flag).     OT Diagnosis:    OT Problem List:   OT Treatment Interventions:     OT Goals(Current goals can be found in the care plan section)    Visit Information  Last OT Received On:  07/07/13 History of Present Illness: s/p ORIF left distal radial fx       Prior Functioning     Home Living Family/patient expects to be discharged to:: Private residence Living Arrangements: Spouse/significant other Available Help at Discharge: Family;Available 24 hours/day Type of Home: House Prior Function Level of Independence: Independent Communication Communication: No difficulties Dominant Hand: Right         Vision/Perception     Cognition  Cognition Arousal/Alertness: Awake/alert Behavior During Therapy: WFL for tasks assessed/performed Overall Cognitive Status: Within Functional Limits for tasks assessed    Extremity/Trunk Assessment Upper Extremity Assessment Upper Extremity Assessment: LUE deficits/detail LUE Deficits / Details: Left digits and shoulder AROM WFL. unable to assess wrist and elbow due to immobilization. LUE: Unable to fully assess due to immobilization     Mobility Bed Mobility Bed Mobility: Supine to Sit;Sitting - Scoot to Edge of Bed;Sit to Supine Supine to Sit: 6: Modified independent (Device/Increase time) Sitting - Scoot to Edge of Bed: 6: Modified independent (Device/Increase time) Sit to Supine: 6: Modified independent (Device/Increase time)     Exercise General Exercises - Upper Extremity Shoulder Flexion: AROM;Left;10 reps Shoulder Extension: AROM;Left;10 reps Digit Composite Flexion: AROM;Left;10 reps Composite Extension: AROM;Left;10 reps   Balance     End of Session OT - End of Session Activity Tolerance: Patient tolerated treatment well Patient left: in bed;with call bell/phone within reach Nurse Communication: Patient  requests pain meds  GO Functional Assessment Tool Used: clinical judgement Functional Limitation: Self care Self Care Current Status (Z6109): At least 1 percent but less than 20 percent impaired, limited or restricted Self Care Goal Status (U0454): At least 1 percent but less than 20 percent impaired,  limited or restricted Self Care Discharge Status [phone]): At least 1 percent but less than 20 percent impaired, limited or restricted   07/07/2013 Cipriano Mile OTR/L Pager [phone] Office [phone]  Cipriano Mile 07/07/2013, 10:17 AM

## 2013-07-07 NOTE — Progress Notes (Signed)
PT Cancellation Note  Patient Details Name: Alvin Simpson MRN: [id number] DOB: [birth year]   Cancelled Treatment:    Reason Eval/Treat Not Completed: PT screened, no needs identified, will sign off   Van Clines [facility] 07/07/2013, 10:29 AM

## 2013-07-07 NOTE — Discharge Summary (Signed)
..  Patient has been seen and examined. Patient has pain appropriate to his injury/process. Patient denies new complaints at this present time. I have discussed his care with nursing staff. Patient is appropriate and alert.  We reviewed vital signs and intake output which are stable.  The upper extremity is neurovascularly intact. Refill is normal. There is no signs of compartment syndrome. There is no signs of dystrophy. There is normal sensation.  Lives at great deal of time discussing range of motion lives and other techniques to decrease edema and promote flexion extension of the fingers. Patient understands the importance of elevation range of motion massage and other measures to lessen pain and prevent swelling.  We have discussed with the patient shoulder range of motion to prevent adhesive capsulitis.  The remainder of the examination is normal today without complicating feature.  Drain was removed without difficulty  Patient will be discharged home. Will plan to see the patient back in the off as per discharge instructions (please see discharge instructions).  Patient had an uneventful hospital course. At the time of discharge patient is stable awake alert and oriented in no acute distress. Regular diet will be continued and has been tolerated. Patient will notify should have problems occur. There is no signs of DVT infection or other complication at this juncture.  All questions have been incurred and answered. 

## 2013-07-07 NOTE — Progress Notes (Signed)
Utilization Review completed.  

## 2013-07-09 ENCOUNTER — Encounter (HOSPITAL_COMMUNITY): Payer: Self-pay | Admitting: Orthopedic Surgery

## 2013-07-15 ENCOUNTER — Ambulatory Visit (HOSPITAL_COMMUNITY): Payer: Medicare Other | Attending: Family Medicine | Admitting: Radiology

## 2013-07-15 ENCOUNTER — Other Ambulatory Visit (HOSPITAL_COMMUNITY): Payer: Self-pay | Admitting: Interventional Cardiology

## 2013-07-15 DIAGNOSIS — I10 Essential (primary) hypertension: Secondary | ICD-10-CM | POA: Insufficient documentation

## 2013-07-15 DIAGNOSIS — R55 Syncope and collapse: Secondary | ICD-10-CM

## 2013-07-15 DIAGNOSIS — I319 Disease of pericardium, unspecified: Secondary | ICD-10-CM | POA: Insufficient documentation

## 2013-07-15 DIAGNOSIS — I079 Rheumatic tricuspid valve disease, unspecified: Secondary | ICD-10-CM | POA: Insufficient documentation

## 2013-07-15 DIAGNOSIS — I379 Nonrheumatic pulmonary valve disorder, unspecified: Secondary | ICD-10-CM | POA: Insufficient documentation

## 2013-07-15 DIAGNOSIS — I2510 Atherosclerotic heart disease of native coronary artery without angina pectoris: Secondary | ICD-10-CM | POA: Insufficient documentation

## 2013-07-15 DIAGNOSIS — Z87891 Personal history of nicotine dependence: Secondary | ICD-10-CM | POA: Insufficient documentation

## 2013-07-15 DIAGNOSIS — E785 Hyperlipidemia, unspecified: Secondary | ICD-10-CM | POA: Insufficient documentation

## 2013-07-15 NOTE — Progress Notes (Signed)
Echocardiogram performed.  

## 2013-07-17 DIAGNOSIS — I059 Rheumatic mitral valve disease, unspecified: Secondary | ICD-10-CM

## 2013-07-24 ENCOUNTER — Ambulatory Visit: Payer: Medicare Other | Admitting: Interventional Cardiology

## 2013-08-08 ENCOUNTER — Other Ambulatory Visit (HOSPITAL_COMMUNITY): Payer: Self-pay | Admitting: Interventional Cardiology

## 2013-08-22 ENCOUNTER — Other Ambulatory Visit: Payer: Self-pay

## 2013-10-11 ENCOUNTER — Emergency Department (HOSPITAL_COMMUNITY): Payer: Medicare Other

## 2013-10-11 ENCOUNTER — Encounter (HOSPITAL_COMMUNITY): Payer: Self-pay | Admitting: Emergency Medicine

## 2013-10-11 ENCOUNTER — Inpatient Hospital Stay (HOSPITAL_COMMUNITY)
Admission: EM | Admit: 2013-10-11 | Discharge: 2013-10-15 | DRG: 310 | Disposition: A | Discharge status: Home / Self Care | Payer: Medicare Other | Attending: Interventional Cardiology | Admitting: Interventional Cardiology

## 2013-10-11 DIAGNOSIS — R55 Syncope and collapse: Secondary | ICD-10-CM

## 2013-10-11 DIAGNOSIS — E876 Hypokalemia: Secondary | ICD-10-CM

## 2013-10-11 DIAGNOSIS — I452 Bifascicular block: Secondary | ICD-10-CM | POA: Diagnosis present

## 2013-10-11 DIAGNOSIS — I453 Trifascicular block: Secondary | ICD-10-CM | POA: Diagnosis present

## 2013-10-11 DIAGNOSIS — I959 Hypotension, unspecified: Secondary | ICD-10-CM | POA: Diagnosis present

## 2013-10-11 DIAGNOSIS — Z7982 Long term (current) use of aspirin: Secondary | ICD-10-CM

## 2013-10-11 DIAGNOSIS — T502X5A Adverse effect of carbonic-anhydrase inhibitors, benzothiadiazides and other diuretics, initial encounter: Secondary | ICD-10-CM | POA: Diagnosis present

## 2013-10-11 DIAGNOSIS — Z79899 Other long term (current) drug therapy: Secondary | ICD-10-CM

## 2013-10-11 DIAGNOSIS — I10 Essential (primary) hypertension: Secondary | ICD-10-CM | POA: Diagnosis present

## 2013-10-11 DIAGNOSIS — I2510 Atherosclerotic heart disease of native coronary artery without angina pectoris: Secondary | ICD-10-CM | POA: Diagnosis present

## 2013-10-11 DIAGNOSIS — Z87891 Personal history of nicotine dependence: Secondary | ICD-10-CM

## 2013-10-11 DIAGNOSIS — E785 Hyperlipidemia, unspecified: Secondary | ICD-10-CM

## 2013-10-11 DIAGNOSIS — I442 Atrioventricular block, complete: Principal | ICD-10-CM | POA: Diagnosis present

## 2013-10-11 DIAGNOSIS — Z951 Presence of aortocoronary bypass graft: Secondary | ICD-10-CM

## 2013-10-11 DIAGNOSIS — Z8546 Personal history of malignant neoplasm of prostate: Secondary | ICD-10-CM

## 2013-10-11 HISTORY — DX: Atherosclerotic heart disease of native coronary artery without angina pectoris: I25.10

## 2013-10-11 LAB — COMPREHENSIVE METABOLIC PANEL
AST: 19 U/L (ref 0–37)
Alkaline Phosphatase: 72 U/L (ref 39–117)
BUN: 17 mg/dL (ref 6–23)
CO2: 32 mEq/L (ref 19–32)
Calcium: 9.3 mg/dL (ref 8.4–10.5)
Chloride: 95 mEq/L — ABNORMAL LOW (ref 96–112)
Creatinine, Ser: 0.71 mg/dL (ref 0.50–1.35)
GFR calc non Af Amer: >90 mL/min (ref >90)
Potassium: 2.6 mEq/L — CL (ref 3.5–5.1)
Sodium: 138 mEq/L (ref 135–145)
Total Bilirubin: 0.6 mg/dL (ref 0.3–1.2)

## 2013-10-11 LAB — CBC
Hemoglobin: 13.4 g/dL (ref 13.0–17.0)
MCH: 31.8 pg (ref 26.0–34.0)
MCHC: 35.2 g/dL (ref 30.0–36.0)
Platelets: 217 10*3/uL (ref 150–400)
RBC: 4.22 MIL/uL (ref 4.22–5.81)
WBC: 9.6 10*3/uL (ref 4.0–10.5)

## 2013-10-11 LAB — PROTIME-INR
INR: 1.02 (ref 0.00–1.49)
Prothrombin Time: 13.2 seconds (ref 11.6–15.2)

## 2013-10-11 LAB — DIFFERENTIAL
Basophils Absolute: 0 10*3/uL (ref 0.0–0.1)
Eosinophils Absolute: 0.2 10*3/uL (ref 0.0–0.7)
Eosinophils Relative: 3 % (ref 0–5)
Lymphs Abs: 2.2 10*3/uL (ref 0.7–4.0)
Neutro Abs: 6.2 10*3/uL (ref 1.7–7.7)
Neutrophils Relative %: 65 % (ref 43–77)

## 2013-10-11 LAB — GLUCOSE, CAPILLARY: Glucose-Capillary: 140 mg/dL — ABNORMAL HIGH (ref 70–99)

## 2013-10-11 LAB — POCT I-STAT TROPONIN I: Troponin i, poc: 0 ng/mL (ref 0.00–0.08)

## 2013-10-11 LAB — APTT: aPTT: 29 seconds (ref 24–37)

## 2013-10-11 LAB — TROPONIN I: Troponin I: <0.3 ng/mL (ref <0.30)

## 2013-10-11 MED ORDER — POTASSIUM CHLORIDE CRYS ER 20 MEQ PO TBCR
40.0000 meq | EXTENDED_RELEASE_TABLET | Freq: Once | ORAL | Status: AC
  Administered 2013-10-11: 40 meq via ORAL
  Filled 2013-10-11: qty 2

## 2013-10-11 NOTE — ED Notes (Signed)
Pt reports that he was told to come in by his PCP for a possible stroke, Dr. Virgina Evener, pt was noted to be in the kitchen and fell to the floor, pt reports he believed he was conscious and talking during this incident but his son states that his eyes rolled back in his head and he was non verbal. Son reports pt took 30 seconds to respond to him.  Pt had a similar incident in September as they believed that the previous incident was related to a drop in BP. Pt noted to be a&o at this time.  Pt ambulatory after this event, denies pain/numbness/tingling. Pt had BP of 169/117 after the incident at home, within a few minutes BP at 155/86.

## 2013-10-11 NOTE — ED Notes (Signed)
CRITICAL VALUE ALERT  Critical value received:  Potassium 2.6 mEq/L Date of notification: 10/11/2013 Time of notification:  2307 Critical value read back:yes Nurse who received alert:  Kai Levins, RN Primary Nurse Notifed: Leotis Shames, RN MD notified: Idalia Needle Time of notification: [phone]

## 2013-10-11 NOTE — ED Provider Notes (Signed)
CSN: [id number]     Arrival date & time 10/11/13  2102 History   First MD Initiated Contact with Patient 10/11/13 2207     Chief Complaint  Patient presents with  . Stroke Symptoms   (Consider location/radiation/quality/duration/timing/severity/associated sxs/prior Treatment) Patient is a 72 y.o. male presenting with syncope. The history is provided by the patient.  Loss of Consciousness Episode history:  Single Most recent episode:  Today Duration:  20 seconds Timing: once. Progression:  Resolved Chronicity:  Recurrent Context comment:  Standing up talking Witnessed: yes   Relieved by:  Nothing Worsened by:  Nothing tried Ineffective treatments:  None tried Associated symptoms: no chest pain, no fever, no headaches, no nausea, no shortness of breath and no vomiting     Past Medical History  Diagnosis Date  . Hypertension   . Cancer prostate    with seeds  . Coronary artery disease    Past Surgical History  Procedure Laterality Date  . Insertion prostate radiation seed    . Ankle surgery    . Coronary artery bypass graft      2  . Cardiac catheterization      18 years ago  . Eye surgery Right     cataract  . Colonoscopy    . Open reduction internal fixation (orif) distal radial fracture Left 07/06/2013    Procedure: OPEN REDUCTION INTERNAL FIXATION (ORIF) LEFT DISTAL RADIAL FRACTURE;  Surgeon: Dominica Severin, MD;  Location: [facility] OR;  Service: Orthopedics;  Laterality: Left;   History reviewed. No pertinent family history. History  Substance Use Topics  . Smoking status: Former Smoker    Quit date: 07/05/1965  . Smokeless tobacco: Never Used  . Alcohol Use: Yes     Comment: occasional    Review of Systems  Constitutional: Negative for fever.  HENT: Negative for drooling and rhinorrhea.   Eyes: Negative for pain.  Respiratory: Negative for cough and shortness of breath.   Cardiovascular: Positive for syncope. Negative for chest pain and leg swelling.   Gastrointestinal: Negative for nausea, vomiting, abdominal pain and diarrhea.  Genitourinary: Negative for dysuria and hematuria.  Musculoskeletal: Negative for gait problem and neck pain.  Skin: Negative for color change.  Neurological: Negative for numbness and headaches.  Hematological: Negative for adenopathy.  Psychiatric/Behavioral: Negative for behavioral problems.  All other systems reviewed and are negative.    Allergies  Review of patient's allergies indicates no known allergies.  Home Medications   Current Outpatient Rx  Name  Route  Sig  Dispense  Refill  . amLODipine (NORVASC) 10 MG tablet   Oral   Take 10 mg by mouth daily.         Marland Kitchen aspirin 81 MG chewable tablet   Oral   Chew 81 mg by mouth daily.         Marland Kitchen atorvastatin (LIPITOR) 20 MG tablet      TAKE 1 TABLET BY MOUTH ONCE A DAY   30 tablet   5   . bimatoprost (LUMIGAN) 0.01 % SOLN   Ophthalmic   Apply 1 drop to eye at bedtime.         Marland Kitchen doxazosin (CARDURA) 4 MG tablet   Oral   Take 4 mg by mouth daily.         . furosemide (LASIX) 20 MG tablet   Oral   Take 20 mg by mouth daily.         . hydrochlorothiazide (HYDRODIURIL) 25 MG tablet   Oral  Take 25 mg by mouth daily.         . irbesartan (AVAPRO) 300 MG tablet   Oral   Take 300 mg by mouth daily.         . metoprolol succinate (TOPROL-XL) 100 MG 24 hr tablet   Oral   Take 100 mg by mouth daily. Take with or immediately following a meal.         . potassium chloride SA (K-DUR,KLOR-CON) 20 MEQ tablet   Oral   Take 20 mEq by mouth daily.          BP 114/63  Pulse 52  Temp(Src) 98.2 F (36.8 C) (Oral)  Resp 16  SpO2 95% Physical Exam  Nursing note and vitals reviewed. Constitutional: He is oriented to person, place, and time. He appears well-developed and well-nourished.  HENT:  Head: Normocephalic and atraumatic.  Right Ear: External ear normal.  Left Ear: External ear normal.  Nose: Nose normal.   Mouth/Throat: Oropharynx is clear and moist. No oropharyngeal exudate.  Eyes: Conjunctivae and EOM are normal. Pupils are equal, round, and reactive to light.  Neck: Normal range of motion. Neck supple.  Cardiovascular: Regular rhythm, normal heart sounds and intact distal pulses.  Exam reveals no gallop and no friction rub.   No murmur heard. Sinus bradycardia HR 59  Pulmonary/Chest: Effort normal and breath sounds normal. No respiratory distress. He has no wheezes.  Abdominal: Soft. Bowel sounds are normal. He exhibits no distension. There is no tenderness. There is no rebound and no guarding.  Musculoskeletal: Normal range of motion. He exhibits no edema and no tenderness.  Mild nonpitting edema in bilateral lower extremities.  Neurological: He is alert and oriented to person, place, and time. He has normal strength. No cranial nerve deficit or sensory deficit. He displays a negative Romberg sign. Coordination and gait normal.  Alert and oriented x3.  Normal speech and understanding.  Normal finger to nose bilaterally.  He ambulates forwards and backwards without difficulty.  Skin: Skin is warm and dry.  Psychiatric: He has a normal mood and affect. His behavior is normal.    ED Course  Procedures (including critical care time) Labs Review Labs Reviewed  CBC - Abnormal; Notable for the following:    HCT 38.1 (*)    All other components within normal limits  COMPREHENSIVE METABOLIC PANEL - Abnormal; Notable for the following:    Potassium 2.6 (*)    Chloride 95 (*)    Glucose, Bld 119 (*)    All other components within normal limits  GLUCOSE, CAPILLARY - Abnormal; Notable for the following:    Glucose-Capillary 140 (*)    All other components within normal limits  LIPID PANEL - Abnormal; Notable for the following:    HDL 33 (*)    All other components within normal limits  BASIC METABOLIC PANEL - Abnormal; Notable for the following:    Potassium 2.9 (*)    Glucose, Bld  194 (*)    Calcium 8.1 (*)    GFR calc non Af Amer 85 (*)    All other components within normal limits  CBC - Abnormal; Notable for the following:    WBC 13.2 (*)    RBC 4.15 (*)    HCT 37.9 (*)    All other components within normal limits  BASIC METABOLIC PANEL - Abnormal; Notable for the following:    Potassium 3.3 (*)    Glucose, Bld 113 (*)    Calcium 8.2 (*)  All other components within normal limits  MRSA PCR SCREENING  PROTIME-INR  APTT  DIFFERENTIAL  TROPONIN I  PROTIME-INR  MAGNESIUM  TSH  BASIC METABOLIC PANEL  POCT I-STAT TROPONIN I   Imaging Review Ct Head (brain) Wo Contrast  10/11/2013   CLINICAL DATA:  Patient fell in his kitchen.  Questionable stroke.  EXAM: CT HEAD WITHOUT CONTRAST  TECHNIQUE: Contiguous axial images were obtained from the base of the skull through the vertex without intravenous contrast.  COMPARISON:  None.  FINDINGS: Ventricles are normal in size and configuration. There are no parenchymal masses or mass effect. Patchy hypoattenuation is noted along the basal ganglia extending to the adjacent centrum semiovales. This is likely a combination of old infarcts in ischemic change. There is no evidence of a recent cortical infarct.  There are no extra-axial masses or abnormal fluid collections.  There is no intracranial hemorrhage.  The visualized sinuses and mastoid air cells are clear.  IMPRESSION: No acute intracranial abnormalities.  Patchy areas of hypoattenuation along the basal ganglia and overlying white matter likely combination of chronic microvascular ischemic change and old infarcts.   Electronically Signed   By: Amie Portland M.D.   On: 10/11/2013 21:37    EKG Interpretation    Date/Time:  Friday October 11 2013 21:08:20 EST Ventricular Rate:  53 PR Interval:  218 QRS Duration: 197 QT Interval:  553 QTC Calculation: 519 R Axis:   -42 Text Interpretation:  Sinus rhythm Borderline prolonged PR interval RBBB and LAFB Probable left  ventricular hypertrophy Confirmed by Brahm Barbeau  MD, Ustin Cruickshank ([id number]) on 10/11/2013 10:26:14 PM           CRITICAL CARE Performed by: Purvis Sheffield, S Total critical care time: 40 minutes Critical care time was exclusive of separately billable procedures and treating other patients. Critical care was necessary to treat or prevent imminent or life-threatening deterioration. Critical care was time spent personally by me on the following activities: development of treatment plan with patient and/or surrogate as well as nursing, discussions with consultants, evaluation of patient's response to treatment, examination of patient, obtaining history from patient or surrogate, ordering and performing treatments and interventions, ordering and review of laboratory studies, ordering and review of radiographic studies, pulse oximetry and re-evaluation of patient's condition.  MDM   1. Syncope   2. Hypokalemia   3. Complete heart block    10:28 PM 72 y.o. male who presents with syncopal episode which occurred at approximately 8 PM this evening while standing in the kitchen talking. The family states that he dropped suddenly midsentence. He was unconscious for approximately 20 seconds until he came to. He is afebrile and mildly bradycardic (HR 59 which pt states is normal). He had a similar syncopal episode in September 2014 with a noncontributory workup at that time. Will get screening labs and CT of head.  Discussed case w/ Cardiology. Will admit to [facility] for likely cardiac related syncope. Will give po potassium now d/t hypokalemia.  At approximately 12:50-1:00 AM the nurse notified me that the pt was feeling dizzy. On exam he felt flushed, was mildly diaphoretic, and had a HR in the 20's. His BP was stable at that time.  A new ecg was performed concerning for complete heart block. 0.5 mg atropine was given and I called to discuss the pt's changes on exam w/ Dr. Zachery Conch (cards). The pt's BP at this time  dropped to 70/50's. Will start low dose dopamine per cardiology rec to maintain BP. Pacer  pads were placed on the pt. Will get CCU bed and transfer to [facility] asap.       Alvin Argyle, MD 10/12/13 [phone]

## 2013-10-12 ENCOUNTER — Inpatient Hospital Stay (HOSPITAL_COMMUNITY): Payer: Medicare Other

## 2013-10-12 DIAGNOSIS — R55 Syncope and collapse: Secondary | ICD-10-CM

## 2013-10-12 DIAGNOSIS — I442 Atrioventricular block, complete: Secondary | ICD-10-CM | POA: Diagnosis present

## 2013-10-12 DIAGNOSIS — E876 Hypokalemia: Secondary | ICD-10-CM

## 2013-10-12 LAB — CBC
MCHC: 35.1 g/dL (ref 30.0–36.0)
Platelets: 210 10*3/uL (ref 150–400)
RBC: 4.15 MIL/uL — ABNORMAL LOW (ref 4.22–5.81)
WBC: 13.2 10*3/uL — ABNORMAL HIGH (ref 4.0–10.5)

## 2013-10-12 LAB — BASIC METABOLIC PANEL
BUN: 17 mg/dL (ref 6–23)
BUN: 14 mg/dL (ref 6–23)
BUN: 15 mg/dL (ref 6–23)
CO2: 30 mEq/L (ref 19–32)
CO2: 30 mEq/L (ref 19–32)
Chloride: 99 mEq/L (ref 96–112)
Chloride: 103 mEq/L (ref 96–112)
Chloride: 103 mEq/L (ref 96–112)
GFR calc Af Amer: >90 mL/min (ref >90)
GFR calc Af Amer: >90 mL/min (ref >90)
GFR calc non Af Amer: 85 mL/min — ABNORMAL LOW (ref >90)
Glucose, Bld: 140 mg/dL — ABNORMAL HIGH (ref 70–99)
Glucose, Bld: 116 mg/dL — ABNORMAL HIGH (ref 70–99)
Potassium: 2.9 mEq/L — ABNORMAL LOW (ref 3.5–5.1)
Potassium: 3.3 mEq/L — ABNORMAL LOW (ref 3.5–5.1)
Potassium: 3 mEq/L — ABNORMAL LOW (ref 3.5–5.1)
Potassium: 3.3 mEq/L — ABNORMAL LOW (ref 3.5–5.1)
Sodium: 140 mEq/L (ref 135–145)
Sodium: 140 mEq/L (ref 135–145)
Sodium: 138 mEq/L (ref 135–145)

## 2013-10-12 LAB — POCT I-STAT, CHEM 8
BUN: 13 mg/dL (ref 6–23)
Calcium, Ion: 1.21 mmol/L (ref 1.13–1.30)
Chloride: 99 mEq/L (ref 96–112)
Creatinine, Ser: 0.9 mg/dL (ref 0.50–1.35)
Potassium: 3.4 mEq/L — ABNORMAL LOW (ref 3.5–5.1)
TCO2: 30 mmol/L (ref 0–100)

## 2013-10-12 LAB — MRSA PCR SCREENING: MRSA by PCR: NEGATIVE

## 2013-10-12 LAB — PROTIME-INR
INR: 1.13 (ref 0.00–1.49)
Prothrombin Time: 14.3 seconds (ref 11.6–15.2)

## 2013-10-12 LAB — LIPID PANEL: HDL: 33 mg/dL — ABNORMAL LOW (ref >39)

## 2013-10-12 LAB — MAGNESIUM: Magnesium: 1.8 mg/dL (ref 1.5–2.5)

## 2013-10-12 MED ORDER — ACETAMINOPHEN 325 MG PO TABS
650.0000 mg | ORAL_TABLET | ORAL | Status: DC | PRN
  Administered 2013-10-15: 650 mg via ORAL
  Filled 2013-10-12: qty 2

## 2013-10-12 MED ORDER — LATANOPROST 0.005 % OP SOLN
1.0000 [drp] | Freq: Every day | OPHTHALMIC | Status: DC
  Administered 2013-10-13 – 2013-10-14 (×3): 1 [drp] via OPHTHALMIC
  Filled 2013-10-12 (×2): qty 2.5

## 2013-10-12 MED ORDER — POTASSIUM CHLORIDE 10 MEQ/50ML IV SOLN
10.0000 meq | INTRAVENOUS | Status: AC
  Administered 2013-10-12 (×4): 10 meq via INTRAVENOUS
  Filled 2013-10-12 (×4): qty 50

## 2013-10-12 MED ORDER — ASPIRIN 81 MG PO CHEW
81.0000 mg | CHEWABLE_TABLET | Freq: Every day | ORAL | Status: DC
  Administered 2013-10-12 – 2013-10-15 (×4): 81 mg via ORAL
  Filled 2013-10-12 (×5): qty 1

## 2013-10-12 MED ORDER — ATORVASTATIN CALCIUM 20 MG PO TABS
20.0000 mg | ORAL_TABLET | Freq: Every evening | ORAL | Status: DC
  Administered 2013-10-12 – 2013-10-15 (×4): 20 mg via ORAL
  Filled 2013-10-12 (×4): qty 1

## 2013-10-12 MED ORDER — SODIUM CHLORIDE 0.9 % IJ SOLN: 3.0000 mL | INTRAMUSCULAR | Status: DC | PRN

## 2013-10-12 MED ORDER — POTASSIUM CHLORIDE CRYS ER 20 MEQ PO TBCR
40.0000 meq | EXTENDED_RELEASE_TABLET | ORAL | Status: AC
  Administered 2013-10-13 (×2): 40 meq via ORAL
  Filled 2013-10-12 (×2): qty 2

## 2013-10-12 MED ORDER — ATROPINE SULFATE 0.1 MG/ML IJ SOLN
0.5000 mg | Freq: Once | INTRAMUSCULAR | Status: AC
  Administered 2013-10-12: 0.5 mg via INTRAVENOUS
  Filled 2013-10-12: qty 10

## 2013-10-12 MED ORDER — POTASSIUM CHLORIDE CRYS ER 20 MEQ PO TBCR: 20.0000 meq | EXTENDED_RELEASE_TABLET | ORAL | Status: DC

## 2013-10-12 MED ORDER — HEPARIN SODIUM (PORCINE) 5000 UNIT/ML IJ SOLN
5000.0000 [IU] | Freq: Three times a day (TID) | INTRAMUSCULAR | Status: DC
  Administered 2013-10-12 – 2013-10-15 (×10): 5000 [IU] via SUBCUTANEOUS
  Filled 2013-10-12 (×13): qty 1

## 2013-10-12 MED ORDER — POTASSIUM CHLORIDE CRYS ER 20 MEQ PO TBCR: 40.0000 meq | EXTENDED_RELEASE_TABLET | Freq: Two times a day (BID) | ORAL | Status: DC

## 2013-10-12 MED ORDER — ONDANSETRON HCL 4 MG/2ML IJ SOLN: 4.0000 mg | Freq: Four times a day (QID) | INTRAMUSCULAR | Status: DC | PRN

## 2013-10-12 MED ORDER — SODIUM CHLORIDE 0.9 % IJ SOLN
3.0000 mL | Freq: Two times a day (BID) | INTRAMUSCULAR | Status: DC
  Administered 2013-10-12 – 2013-10-15 (×6): 3 mL via INTRAVENOUS

## 2013-10-12 MED ORDER — DOPAMINE-DEXTROSE 3.2-5 MG/ML-% IV SOLN
5.0000 ug/kg/min | Freq: Once | INTRAVENOUS | Status: DC
  Administered 2013-10-12: 5 ug/kg/min via INTRAVENOUS

## 2013-10-12 MED ORDER — DOPAMINE-DEXTROSE 3.2-5 MG/ML-% IV SOLN: 5.0000 ug/min | INTRAVENOUS | Status: DC

## 2013-10-12 MED ORDER — DOPAMINE-DEXTROSE 3.2-5 MG/ML-% IV SOLN
5.0000 ug/min | Freq: Once | INTRAVENOUS | Status: AC
  Administered 2013-10-12: 5 ug/min via INTRAVENOUS
  Filled 2013-10-12: qty 250

## 2013-10-12 MED ORDER — POTASSIUM CHLORIDE CRYS ER 20 MEQ PO TBCR
40.0000 meq | EXTENDED_RELEASE_TABLET | Freq: Once | ORAL | Status: AC
  Administered 2013-10-12: 40 meq via ORAL
  Filled 2013-10-12: qty 2

## 2013-10-12 MED ORDER — POTASSIUM CHLORIDE CRYS ER 20 MEQ PO TBCR
40.0000 meq | EXTENDED_RELEASE_TABLET | Freq: Two times a day (BID) | ORAL | Status: AC
  Administered 2013-10-12: 40 meq via ORAL
  Filled 2013-10-12: qty 2

## 2013-10-12 MED ORDER — SODIUM CHLORIDE 0.9 % IV SOLN
250.0000 mL | INTRAVENOUS | Status: DC | PRN
  Administered 2013-10-12: 250 mL via INTRAVENOUS

## 2013-10-12 MED ORDER — DOPAMINE-DEXTROSE 3.2-5 MG/ML-% IV SOLN
2.0000 ug/kg/min | INTRAVENOUS | Status: DC
  Administered 2013-10-12: 6 ug/kg/min via INTRAVENOUS

## 2013-10-12 MED FILL — Medication: Qty: 1 | Status: AC

## 2013-10-12 NOTE — H&P (Signed)
Patient ID: Alvin Simpson MRN: [id number], DOB/AGE: 1940/11/05   Admit date: 10/11/2013   Primary Physician: Lolita Patella, MD Primary Cardiologist: Eldridge Dace  Pt. Profile: Alvin Simpson is a 72 year old male with a history of CAD s/p CABG, hypertension, prostate cancer, with a recent episode of syncope who is admitted with recurrent syncope and found to have complete heart block.   Problem List  Past Medical History  Diagnosis Date  . Hypertension   . Cancer prostate    with seeds  . Coronary artery disease     Past Surgical History  Procedure Laterality Date  . Insertion prostate radiation seed    . Ankle surgery    . Coronary artery bypass graft      2  . Cardiac catheterization      18 years ago  . Eye surgery Right     cataract  . Colonoscopy    . Open reduction internal fixation (orif) distal radial fracture Left 07/06/2013    Procedure: OPEN REDUCTION INTERNAL FIXATION (ORIF) LEFT DISTAL RADIAL FRACTURE;  Surgeon: Dominica Severin, MD;  Location: [facility] OR;  Service: Orthopedics;  Laterality: Left;     Allergies  No Known Allergies  HPI   Alvin Simpson is a 72 year old male with a history of CAD s/p CABG, hypertension, prostate cancer, with a recent episode of syncope who is admitted with recurrent syncope.  Today, while in the kitchen with his family Alvin Simpson syncopized while standing up talking to his family.   He experienced a few seconds of lightheadedness and passed out for about 20 seconds. Shortly after waking up, he felt his normal self. No bowel or bladder incontinence. No chest pain, SOB, chest pressure, LE edema. A similar event occurred in September 2014. Work-up at that time revealed an essentially normal echocardiogram.   In the [facility] ED, ECG demonstrated trifascicular block. Labs notable for K 2.6. Head CT demonstrated patchy areas of hypoattenuation along the basal ganglia and overlying white matter likely combination of chronic microvascular ischemic  change and old infarcts but no acute pathology. While transferred was arranged to [facility], Alvin Simpson developed CHB and hypotension. Dopamine 6mcg/kg/min was started and he was urgent transferred to the [facility] CCU.   Immediately after arrival to the CCU Alvin Simpson had another transient episode of CHB with syncope and an emergent temporary pacing wire was placed via the R IJ.   Home Medications  Prior to Admission medications   Medication Sig Start Date End Date Taking? Authorizing Provider  amLODipine (NORVASC) 10 MG tablet Take 10 mg by mouth daily.   Yes Historical Provider, MD  aspirin 81 MG chewable tablet Chew 81 mg by mouth daily.   Yes Historical Provider, MD  atorvastatin (LIPITOR) 20 MG tablet Take 20 mg by mouth every evening.   Yes Historical Provider, MD  bimatoprost (LUMIGAN) 0.01 % SOLN Apply 1 drop to eye at bedtime.   Yes Historical Provider, MD  Coenzyme Q10 (COQ10 PO) Take 1 capsule by mouth every evening.   Yes Historical Provider, MD  doxazosin (CARDURA) 4 MG tablet Take 4 mg by mouth daily.   Yes Historical Provider, MD  furosemide (LASIX) 20 MG tablet Take 20 mg by mouth every Monday, Wednesday, and Friday.    Yes Historical Provider, MD  hydrochlorothiazide (HYDRODIURIL) 25 MG tablet Take 25 mg by mouth daily.   Yes Historical Provider, MD  irbesartan (AVAPRO) 300 MG tablet Take 300 mg by mouth daily.  Yes Historical Provider, MD  metoprolol succinate (TOPROL-XL) 100 MG 24 hr tablet Take 100 mg by mouth daily. Take with or immediately following a meal.   Yes Historical Provider, MD  potassium chloride SA (K-DUR,KLOR-CON) 20 MEQ tablet Take 20 mEq by mouth every Monday, Wednesday, and Friday.  06/25/13  Yes Historical Provider, MD    Family History  History reviewed. No pertinent family history.  Social History  History   Social History  . Marital Status: Married    Spouse Name: N/A    Number of Children: N/A  . Years of Education: N/A   Occupational History  . Not on  file.   Social History Main Topics  . Smoking status: Former Smoker    Quit date: 07/05/1965  . Smokeless tobacco: Never Used  . Alcohol Use: Yes     Comment: occasional  . Drug Use: No  . Sexual Activity: Not on file   Other Topics Concern  . Not on file   Social History Narrative  . No narrative on file     Review of Systems General:  No chills, fever, night sweats or weight changes.  Cardiovascular:  No chest pain, dyspnea on exertion, edema, orthopnea, palpitations, paroxysmal nocturnal dyspnea. Dermatological: No rash, lesions/masses Respiratory: No cough, dyspnea Urologic: No hematuria, dysuria Abdominal:   No nausea, vomiting, diarrhea, bright red blood per rectum, melena, or hematemesis Neurologic:  No visual changes, wkns, changes in mental status. All other systems reviewed and are otherwise negative except as noted above.  Physical Exam  Blood pressure 110/44, pulse 80, temperature 97.4 F (36.3 C), temperature source Oral, resp. rate 15, height 5\' 7"  (1.702 m), weight 90.4 kg (199 lb 4.7 oz), SpO2 97.00%.  General: Pleasant, diaphoretic Psych: Normal affect. Neuro: Alert and oriented X 3. Moves all extremities spontaneously. HEENT: Normal  Neck: Supple without bruits or JVD. Lungs:  Resp regular and unlabored, CTA. Heart: RRR no s3, s4, or murmurs. Abdomen: Soft, non-tender, non-distended, BS + x 4.  Extremities: No clubbing, cyanosis or edema. DP/PT/Radials 2+ and equal bilaterally.  Labs  Troponin [facility] Test)  Recent Labs  10/11/13 2223  TROPIPOC 0.00    Recent Labs  10/11/13 2209  TROPONINI <0.30   Lab Results  Component Value Date   WBC 9.6 10/11/2013   HGB 13.4 10/11/2013   HCT 38.1* 10/11/2013   MCV 90.3 10/11/2013   PLT 217 10/11/2013    Recent Labs Lab 10/11/13 2209  NA 138  K 2.6*  CL 95*  CO2 32  BUN 17  CREATININE 0.71  CALCIUM 9.3  PROT 7.2  BILITOT 0.6  ALKPHOS 72  ALT 23  AST 19  GLUCOSE 119*   No  results found for this basename: CHOL, HDL, LDLCALC, TRIG   No results found for this basename: DDIMER     Radiology/Studies  Ct Head (brain) Wo Contrast  10/11/2013   CLINICAL DATA:  Patient fell in his kitchen.  Questionable stroke.  EXAM: CT HEAD WITHOUT CONTRAST  TECHNIQUE: Contiguous axial images were obtained from the base of the skull through the vertex without intravenous contrast.  COMPARISON:  None.  FINDINGS: Ventricles are normal in size and configuration. There are no parenchymal masses or mass effect. Patchy hypoattenuation is noted along the basal ganglia extending to the adjacent centrum semiovales. This is likely a combination of old infarcts in ischemic change. There is no evidence of a recent cortical infarct.  There are no extra-axial masses or abnormal fluid  collections.  There is no intracranial hemorrhage.  The visualized sinuses and mastoid air cells are clear.  IMPRESSION: No acute intracranial abnormalities.  Patchy areas of hypoattenuation along the basal ganglia and overlying white matter likely combination of chronic microvascular ischemic change and old infarcts.   Electronically Signed   By: Amie Portland M.D.   On: 10/11/2013 21:37    ECG ECG 07/08/2013 - 1st degree AV block, RBBB, LAD ECG 10/11/2013 - trifascicular block Tele 10/12/2013: Intermittent CHB  TTE 07/15/13 - Left ventricle: The cavity size was normal. There was mild   focal basal hypertrophy of the septum. Systolic function   was normal. The estimated ejection fraction was in the   range of 55% to 60%. Wall motion was normal; there were no   regional wall motion abnormalities. - Aortic valve: Mild regurgitation. - Mitral valve: Calcified annulus. Mild to moderate   regurgitation. - Left atrium: The atrium was moderately dilated. - Right ventricle: The cavity size was mildly dilated. Wall   thickness was normal. - Right atrium: The atrium was mildly dilated. - Pulmonary arteries: Systolic  pressure was mildly   increased. PA peak pressure: 34mm Hg (S).    ASSESSMENT AND PLAN  Alvin Simpson is a 72 year old male with a history of CAD s/p CABG, hypertension, prostate cancer, with a recent episode of syncope who is admitted with recurrent syncope and found to have complete heart block. 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   1. S/p temporary pacemaker insertion 2. Anticipate permanent left sided dual chamber pacemaker on Monday 3. Wean dopamine as tolerated 4. Aggressive K repletion via central line 5. Hold antihypertensives and nodal agents.  Signed, Glori Luis, MD 10/12/2013, 4:00 AM

## 2013-10-12 NOTE — ED Notes (Signed)
Dopamine at 10 mcg at this time.

## 2013-10-12 NOTE — Procedures (Signed)
Central Venous Catheter (CVC, Central Line) and Temporary Wire Placement Date: .10/12/13 Time: 330am Indication: CHB Attending: Glori Luis, MD A time-out was completed verifying correct patient, procedure, site, positioning, and special equipment if applicable. The patient was placed in a dependent position appropriate for central line placement based on the vein to be cannulated. The patient's L neck was prepped and draped in sterile fashion. 1% Lidocaine was used to anesthetize the surrounding skin area. A single lumen 5Fr sheath r was introduced into the the R IJ using the Seldinger technique and under ultrasound guidance. The catheter was threaded smoothly over the guide wire and appropriate blood return was obtained. The lumen of the catheter was evacuated of air and flushed with sterile saline. The catheter was then sutured in place to the skin and a sterile dressing applied. Perfusion to the extremity distal to the point of catheter insertion was checked and found to be adequate. Dr. Zachery Conch was present for the entire procedure.  After advancing temp wire ~35cm to RV apex adequate capture was obtained. Threshold 0.63mA,   Temp wire set to VVI 60 with 5mA output  Estimated Blood Loss: 10cc The patient tolerated the procedure well and there were no complications.  CCT 30 minutes

## 2013-10-12 NOTE — Progress Notes (Addendum)
Reviewed H and P and agree.   I turned pacer down to 50bpm.   Washing out metoprolol ER 100mg .  TTE 07/15/13  - Left ventricle: The cavity size was normal. There was mild  focal basal hypertrophy of the septum. Systolic function  was normal. The estimated ejection fraction was in the  range of 55% to 60%. Wall motion was normal; there were no  regional wall motion abnormalities.  - Aortic valve: Mild regurgitation.  - Mitral valve: Calcified annulus. Mild to moderate  regurgitation.  - Left atrium: The atrium was moderately dilated.  - Right ventricle: The cavity size was mildly dilated. Wall  thickness was normal.  - Right atrium: The atrium was mildly dilated.  - Pulmonary arteries: Systolic pressure was mildly  increased. PA peak pressure: 34mm Hg (S).  Trifascicular block - syncope.   Pacer Monday. Will consult EP for pacer Monday.   Hypokalemia - replete. X2 in addition to IV. Will recheck BMET at 3pm.

## 2013-10-13 ENCOUNTER — Encounter (HOSPITAL_COMMUNITY): Payer: Self-pay | Admitting: Cardiology

## 2013-10-13 DIAGNOSIS — I2510 Atherosclerotic heart disease of native coronary artery without angina pectoris: Secondary | ICD-10-CM

## 2013-10-13 DIAGNOSIS — E785 Hyperlipidemia, unspecified: Secondary | ICD-10-CM | POA: Diagnosis present

## 2013-10-13 HISTORY — DX: Atherosclerotic heart disease of native coronary artery without angina pectoris: I25.10

## 2013-10-13 LAB — BASIC METABOLIC PANEL
CO2: 29 mEq/L (ref 19–32)
Calcium: 7.7 mg/dL — ABNORMAL LOW (ref 8.4–10.5)
Chloride: 106 mEq/L (ref 96–112)
Creatinine, Ser: 0.69 mg/dL (ref 0.50–1.35)
GFR calc non Af Amer: >90 mL/min (ref >90)
Glucose, Bld: 110 mg/dL — ABNORMAL HIGH (ref 70–99)
Sodium: 141 mEq/L (ref 135–145)

## 2013-10-13 MED ORDER — POTASSIUM CHLORIDE CRYS ER 20 MEQ PO TBCR
40.0000 meq | EXTENDED_RELEASE_TABLET | Freq: Two times a day (BID) | ORAL | Status: DC
  Administered 2013-10-13 – 2013-10-15 (×5): 40 meq via ORAL
  Filled 2013-10-13 (×6): qty 2

## 2013-10-13 NOTE — Progress Notes (Signed)
ISTAT BMET done.  K+ 3.4 to Dr. Dorris Fetch.  Orders rec'd.

## 2013-10-13 NOTE — Progress Notes (Addendum)
Subjective:  Had a good day yesterday, feels better. Aggressive potassium supplementation yesterday. Right IJ temporary pacemaker wire in place.  Objective:  Vital Signs in the last 24 hours: Temp:  [97.2 F (36.2 C)-98.6 F (37 C)] 98.6 F (37 C) (12/28 0700) Pulse Rate:  [49-65] 50 (12/28 0700) Resp:  [11-25] 15 (12/28 0700) BP: (106-159)/(53-102) 130/73 mmHg (12/28 0500) SpO2:  [94 %-99 %] 95 % (12/28 0700) Weight:  [202 lb 6.1 oz (91.8 kg)] 202 lb 6.1 oz (91.8 kg) (12/28 0344)  Intake/Output from previous day: 12/27 0701 - 12/28 0700 In: 1423.7 [P.O.:912; I.V.:461.7; IV Piggyback:50] Out: 1400 [Urine:1400]   Physical Exam: General: Well developed, well nourished, in no acute distress. Head:  Normocephalic and atraumatic. Right IJ temporary pacemaker sheath/wire in place. Lungs: Clear to auscultation and percussion. Heart: Cardiac, regular.  No murmur, rubs or gallops. Prior bypass scar noted Abdomen: soft, non-tender, positive bowel sounds. Extremities: No clubbing or cyanosis. No edema. Neurologic: Alert and oriented x 3.    Lab Results:  Recent Labs  10/11/13 2209 10/12/13 0953 10/12/13 2333  WBC 9.6 13.2*  --   HGB 13.4 13.3 11.6*  PLT 217 210  --     Recent Labs  10/12/13 1716 10/12/13 2333 10/13/13 0452  NA 140 144 141  K 3.3* 3.4* 3.6  CL 103 99 106  CO2 32  --  29  GLUCOSE 116* 136* 110*  BUN 15 13 12   CREATININE 0.80 0.90 0.69    Recent Labs  10/11/13 2209  TROPONINI <0.30   Hepatic Function Panel  Recent Labs  10/11/13 2209  PROT 7.2  ALBUMIN 4.0  AST 19  ALT 23  ALKPHOS 72  BILITOT 0.6    Recent Labs  10/12/13 0400  CHOL 143   No results found for this basename: PROTIME,  in the last 72 hours  Imaging: Ct Head (brain) Wo Contrast  10/11/2013   CLINICAL DATA:  Patient fell in his kitchen.  Questionable stroke.  EXAM: CT HEAD WITHOUT CONTRAST  TECHNIQUE: Contiguous axial images were obtained from the base of the  skull through the vertex without intravenous contrast.  COMPARISON:  None.  FINDINGS: Ventricles are normal in size and configuration. There are no parenchymal masses or mass effect. Patchy hypoattenuation is noted along the basal ganglia extending to the adjacent centrum semiovales. This is likely a combination of old infarcts in ischemic change. There is no evidence of a recent cortical infarct.  There are no extra-axial masses or abnormal fluid collections.  There is no intracranial hemorrhage.  The visualized sinuses and mastoid air cells are clear.  IMPRESSION: No acute intracranial abnormalities.  Patchy areas of hypoattenuation along the basal ganglia and overlying white matter likely combination of chronic microvascular ischemic change and old infarcts.   Electronically Signed   By: Amie Portland M.D.   On: 10/11/2013 21:37   Dg Chest Port 1 View  10/12/2013   CLINICAL DATA:  Temporary pacing wire, right-sided central venous line.  EXAM: PORTABLE CHEST - 1 VIEW  COMPARISON:  Chest radiograph July 14, 2013  FINDINGS: Right internal jugular central venous catheter sheath, with wire projecting in the right ventricle. Multiple EKG lines overlie the patient and may obscure subtle underlying pathology.  The cardiac silhouette appears mildly enlarged, status post median sternotomy for apparent Coronary artery bypass grafting. Low inspiratory portable examination with crowded vasculature markings, mild chronic interstitial changes without superimposed pleural effusions or focal consolidations. Mild biapical pleural thickening.  No pneumothorax.  Surgical clips in project in the abdomen.  IMPRESSION: Pacer wire via right internal jugular sheath, tip projects in right ventricle.  Mild cardiomegaly and mild chronic interstitial changes in this low inspiratory portable examination.   Electronically Signed   By: Awilda Metro   On: 10/12/2013 04:14   Dg C-arm 1-60 Min-no Report  10/12/2013   CLINICAL  DATA: pacemaker lead   C-ARM 1-60 MINUTES  Fluoroscopy was utilized by the requesting physician.  No radiographic  interpretation.    Personally viewed.   Telemetry: Sinus bradycardia, right bundle branch block, rare PVC Personally viewed.   EKG:  Sinus bradycardia rate 55 beats per minute, first degree AV block, right bundle branch block, left anterior fascicular block, trifascicular block.  Cardiac Studies:   Echocardiogram 07/15/13:  - Left ventricle: The cavity size was normal. There was mild focal basal hypertrophy of the septum. Systolic function was normal. The estimated ejection fraction was in the range of 55% to 60%. Wall motion was normal; there were no regional wall motion abnormalities. - Aortic valve: Mild regurgitation. - Mitral valve: Calcified annulus. Mild to moderate regurgitation. - Left atrium: The atrium was moderately dilated. - Right ventricle: The cavity size was mildly dilated. Wall thickness was normal. - Right atrium: The atrium was mildly dilated. - Pulmonary arteries: Systolic pressure was mildly increased. PA peak pressure: 34mm Hg (S).   Assessment/Plan:   72 year old male with 2 prior syncopal episodes in the setting of trifascicular block, with prior bypass surgery approximately 18 years ago in [place], who was on metoprolol ER 100 mg once a day and also had hypokalemia on admission. Currently with temporary pacemaker wire via right IJ.  1. Syncope-complete heart block previously described. 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 Of course, metoprolol and hypokalemia potentiated bradycardia however given his underlying long-standing conduction disorder, pacemaker is likely indicated. Had a second episode upon arrival in the CCU with brief syncope and complete heart block requiring temporary pacemaker wire placement. Dopamine was utilized initially but now is off. We'll  make him n.p.o. past midnight for pacemaker placement and consult electrophysiology in the morning. I carefully described to he and his family the possible scenarios.   2. Hypokalemia-aggressively repleted. Continue to monitor closely. He was taking furosemide 20 mg every Monday Wednesday and Friday as well as hydrochlorothiazide 25 mg a day. Perhaps the combination of these 2 diuretics led to hypokalemia. Currently on hold.  3. Coronary artery disease-status post bypass-stable, no evidence of angina subjectively. Feels well. On aspirin.  4. Hyperlipidemia-continue with atorvastatin.      Pierrette Scheu 10/13/2013, 9:20 AM    ADDENDUM: 5:15pm Pacer spike was not correlating with V pacing. I tested pacer. Seemed to be atrial pacing. Inflated balloon and advanced pacer wire approx 2 cm. Ventricular capture noted. Balloon deflated. Pacer line taped to maintain position. No PVC's. No symptoms after wire positioning.  Continue to monitor. Has been quite stable all day in mid 50's.

## 2013-10-14 ENCOUNTER — Other Ambulatory Visit: Payer: Self-pay

## 2013-10-14 LAB — BASIC METABOLIC PANEL
CO2: 27 mEq/L (ref 19–32)
Calcium: 8.4 mg/dL (ref 8.4–10.5)
Creatinine, Ser: 0.57 mg/dL (ref 0.50–1.35)
GFR calc Af Amer: >90 mL/min (ref >90)
Glucose, Bld: 126 mg/dL — ABNORMAL HIGH (ref 70–99)
Potassium: 3.9 mEq/L (ref 3.5–5.1)

## 2013-10-14 MED ORDER — HYDRALAZINE HCL 20 MG/ML IJ SOLN
10.0000 mg | Freq: Once | INTRAMUSCULAR | Status: AC
  Administered 2013-10-14: 10 mg via INTRAVENOUS
  Filled 2013-10-14: qty 1

## 2013-10-14 MED ORDER — IRBESARTAN 300 MG PO TABS
300.0000 mg | ORAL_TABLET | Freq: Every day | ORAL | Status: DC
  Administered 2013-10-14 – 2013-10-15 (×2): 300 mg via ORAL
  Filled 2013-10-14 (×2): qty 1

## 2013-10-14 MED ORDER — HYDROCHLOROTHIAZIDE 25 MG PO TABS
25.0000 mg | ORAL_TABLET | Freq: Every day | ORAL | Status: DC
  Administered 2013-10-14 – 2013-10-15 (×2): 25 mg via ORAL
  Filled 2013-10-14 (×2): qty 1

## 2013-10-14 NOTE — Consult Note (Signed)
ELECTROPHYSIOLOGY CONSULT NOTE  Patient ID: Alvin Simpson, MRN: [id number], DOB/AGE: 03/25/1941 72 y.o. Admit date: 10/11/2013 Date of Consult: 10/14/2013  Primary Physician: Lolita Patella, MD Primary Cardiologist: JV  Chief Complaint: syncope   HPI Alvin Simpson is a 72 y.o. male  Admitted with syncope and complete heart block in setting of hypokalemia-now repleted and high dose metoprolol>>Held Also known CAD with CABG and echo 9/14>>.normal LVfcuntion  Prior syncope with change in meds in September with Hx suggestive of orthostasis  The patient denies chest pain, shortness of breath, nocturnal dyspnea, orthopnea or peripheral edema.  There have been no palpitations Very active    Past Medical History  Diagnosis Date  . Hypertension   . Cancer prostate    with seeds  . Coronary artery disease   . Coronary atherosclerosis of native coronary artery 10/13/2013    CABG-[place]-1998      Surgical History:  Past Surgical History  Procedure Laterality Date  . Insertion prostate radiation seed    . Ankle surgery    . Coronary artery bypass graft      2  . Cardiac catheterization      18 years ago  . Eye surgery Right     cataract  . Colonoscopy    . Open reduction internal fixation (orif) distal radial fracture Left 07/06/2013    Procedure: OPEN REDUCTION INTERNAL FIXATION (ORIF) LEFT DISTAL RADIAL FRACTURE;  Surgeon: Dominica Severin, MD;  Location: [facility] OR;  Service: Orthopedics;  Laterality: Left;     Home Meds: Prior to Admission medications   Medication Sig Start Date End Date Taking? Authorizing Provider  amLODipine (NORVASC) 10 MG tablet Take 10 mg by mouth daily.   Yes Historical Provider, MD  aspirin 81 MG chewable tablet Chew 81 mg by mouth daily.   Yes Historical Provider, MD  atorvastatin (LIPITOR) 20 MG tablet Take 20 mg by mouth every evening.   Yes Historical Provider, MD  bimatoprost (LUMIGAN) 0.01 % SOLN Apply 1 drop to eye at bedtime.   Yes  Historical Provider, MD  Coenzyme Q10 (COQ10 PO) Take 1 capsule by mouth every evening.   Yes Historical Provider, MD  doxazosin (CARDURA) 4 MG tablet Take 4 mg by mouth daily.   Yes Historical Provider, MD  furosemide (LASIX) 20 MG tablet Take 20 mg by mouth every Monday, Wednesday, and Friday.    Yes Historical Provider, MD  hydrochlorothiazide (HYDRODIURIL) 25 MG tablet Take 25 mg by mouth daily.   Yes Historical Provider, MD  irbesartan (AVAPRO) 300 MG tablet Take 300 mg by mouth daily.   Yes Historical Provider, MD  metoprolol succinate (TOPROL-XL) 100 MG 24 hr tablet Take 100 mg by mouth daily. Take with or immediately following a meal.   Yes Historical Provider, MD  potassium chloride SA (K-DUR,KLOR-CON) 20 MEQ tablet Take 20 mEq by mouth every Monday, Wednesday, and Friday.  06/25/13  Yes Historical Provider, MD    Inpatient Medications:  . aspirin  81 mg Oral Daily  . atorvastatin  20 mg Oral QPM  . heparin  5,000 Units Subcutaneous Q8H  . latanoprost  1 drop Both Eyes QHS  . potassium chloride SA  40 mEq Oral BID  . sodium chloride  3 mL Intravenous Q12H     Allergies: No Known Allergies  History   Social History  . Marital Status: Married    Spouse Name: N/A    Number of Children: N/A  . Years of Education: N/A  Occupational History  . Not on file.   Social History Main Topics  . Smoking status: Former Smoker    Quit date: 07/05/1965  . Smokeless tobacco: Never Used  . Alcohol Use: Yes     Comment: occasional  . Drug Use: No  . Sexual Activity: Not on file   Other Topics Concern  . Not on file   Social History Narrative  . No narrative on file     History reviewed. No pertinent family history.   ROS:  Please see the history of present illness.     All other systems reviewed and negative.    Physical Exam:   Blood pressure 162/85, pulse 63, temperature 98 F (36.7 C), temperature source Oral, resp. rate 15, height 5\' 7"  (1.702 m), weight 202 lb 13.2 oz  (92 kg), SpO2 94.00%. General: Well developed, well nourished male in no acute distress. Head: Normocephalic, atraumatic, sclera non-icteric, no xanthomas, nares are without discharge. EENT: normal Lymph Nodes:  none Back: without scoliosis/kyphosis *, no CVA tendersness Neck: Negative for carotid bruits.  Pacer in R neck. Lungs: Clear bilaterally to auscultation without wheezes, rales, or rhonchi. Breathing is unlabored. Heart: RRR with S1 S2. No  murmur , rubs, or gallops appreciated. Abdomen: Soft, non-tender, non-distended with normoactive bowel sounds. No hepatomegaly. No rebound/guarding. No obvious abdominal masses. Msk:  Strength and tone appear normal for age. Extremities: No clubbing or cyanosis. No  edema.  Distal pedal pulses are 2+ and equal bilaterally.+SCDs Skin: Warm and Dry Neuro: Alert and oriented X 3. CN III-XII intact Grossly normal sensory and motor function . Psych:  Responds to questions appropriately with a normal affect.      Labs: Cardiac Enzymes  Recent Labs  10/11/13 2209  TROPONINI <0.30   CBC Lab Results  Component Value Date   WBC 13.2* 10/12/2013   HGB 11.6* 10/12/2013   HCT 34.0* 10/12/2013   MCV 91.3 10/12/2013   PLT 210 10/12/2013   PROTIME:  Recent Labs  10/11/13 2209 10/12/13 0619  LABPROT 13.2 14.3  INR 1.02 1.13   Chemistry  Recent Labs Lab 10/11/13 2209  10/14/13 0410  NA 138  < > 138  K 2.6*  < > 3.9  CL 95*  < > 103  CO2 32  < > 27  BUN 17  < > 7  CREATININE 0.71  < > 0.57  CALCIUM 9.3  < > 8.4  PROT 7.2  --   --   BILITOT 0.6  --   --   ALKPHOS 72  --   --   ALT 23  --   --   AST 19  --   --   GLUCOSE 119*  < > 126*  < > = values in this interval not displayed. Lipids Lab Results  Component Value Date   CHOL 143 10/12/2013   HDL 33* 10/12/2013   LDLCALC 94 10/12/2013   TRIG 80 10/12/2013   BNP No results found for this basename: probnp   Miscellaneous No results found for this basename: DDIMER     Radiology/Studies:  Ct Head (brain) Wo Contrast  10/11/2013   CLINICAL DATA:  Patient fell in his kitchen.  Questionable stroke.  EXAM: CT HEAD WITHOUT CONTRAST  TECHNIQUE: Contiguous axial images were obtained from the base of the skull through the vertex without intravenous contrast.  COMPARISON:  None.  FINDINGS: Ventricles are normal in size and configuration. There are no parenchymal masses or mass effect. Patchy hypoattenuation  is noted along the basal ganglia extending to the adjacent centrum semiovales. This is likely a combination of old infarcts in ischemic change. There is no evidence of a recent cortical infarct.  There are no extra-axial masses or abnormal fluid collections.  There is no intracranial hemorrhage.  The visualized sinuses and mastoid air cells are clear.  IMPRESSION: No acute intracranial abnormalities.  Patchy areas of hypoattenuation along the basal ganglia and overlying white matter likely combination of chronic microvascular ischemic change and old infarcts.   Electronically Signed   By: Amie Portland M.D.   On: 10/11/2013 21:37   Dg Chest Port 1 View  10/12/2013   CLINICAL DATA:  Temporary pacing wire, right-sided central venous line.  EXAM: PORTABLE CHEST - 1 VIEW  COMPARISON:  Chest radiograph July 14, 2013  FINDINGS: Right internal jugular central venous catheter sheath, with wire projecting in the right ventricle. Multiple EKG lines overlie the patient and may obscure subtle underlying pathology.  The cardiac silhouette appears mildly enlarged, status post median sternotomy for apparent Coronary artery bypass grafting. Low inspiratory portable examination with crowded vasculature markings, mild chronic interstitial changes without superimposed pleural effusions or focal consolidations. Mild biapical pleural thickening. No pneumothorax.  Surgical clips in project in the abdomen.  IMPRESSION: Pacer wire via right internal jugular sheath, tip projects in right  ventricle.  Mild cardiomegaly and mild chronic interstitial changes in this low inspiratory portable examination.   Electronically Signed   By: Awilda Metro   On: 10/12/2013 04:14   Dg C-arm 1-60 Min-no Report  10/12/2013   CLINICAL DATA: pacemaker lead   C-ARM 1-60 MINUTES  Fluoroscopy was utilized by the requesting physician.  No radiographic  interpretation.     EKG:   Assessment and Plan:    Intermittent complete heart block  Syncope  trifasicular block  CAD CABG>>nl LVEF  Hypokalemia  Resolved  Hypertension   Pt with now resolved heart block.  No indication for pacing at present time.  It is statistically likely (50%) or so that he will hve recurrence of conduction system disease and so monitoring is a reasonable interim strategy  Will discuss with Dr Hedy Jacob   Will resume avapro and hctz for BP  WIfe is quite anxious about a strategy of watching with monitoring.    I have reprogrammed the pacer sensitivity 2.0>>1,0 and decreased rate from 50>>30 as there has been some competitive pacing      [employer]

## 2013-10-14 NOTE — Care Management Note (Signed)
    Page 1 of 1   10/14/2013     10:41:39 AM   CARE MANAGEMENT NOTE 10/14/2013  Patient:  Alvin Simpson, Alvin Simpson   Account Number:  [id number]  Date Initiated:  10/14/2013  Documentation initiated by:  Junius Creamer  Subjective/Objective Assessment:   adm w complete heart block     Action/Plan:   lives w wife, pcp dr Molly Maduro reade   Anticipated DC Date:     Anticipated DC Plan:           Choice offered to / List presented to:             Status of service:   Medicare Important Message given?   (If response is "NO", the following Medicare IM given date fields will be blank) Date Medicare IM given:   Date Additional Medicare IM given:    Discharge Disposition:    Per UR Regulation:  Reviewed for med. necessity/level of care/duration of stay  If discussed at Long Length of Stay Meetings, dates discussed:    Comments:

## 2013-10-14 NOTE — Progress Notes (Addendum)
Subjective:  Had a good day yesterday, feels better. Aggressive potassium supplementation yesterday. Right IJ temporary pacemaker wire in place.  Syncopal episode while in the kitchen.  Same as what happened in [place] state several months ago.  Mother had a pacer.  Objective:  Vital Signs in the last 24 hours: Temp:  [98 F (36.7 C)-98.8 F (37.1 C)] 98.2 F (36.8 C) (12/29 0738) Pulse Rate:  [46-83] 56 (12/29 0800) Resp:  [12-24] 14 (12/29 0800) BP: (129-179)/(58-94) 157/90 mmHg (12/29 0800) SpO2:  [92 %-99 %] 95 % (12/29 0800) Weight:  [202 lb 13.2 oz (92 kg)] 202 lb 13.2 oz (92 kg) (12/29 0500)  Intake/Output from previous day: 12/28 0701 - 12/29 0700 In: 1250 [P.O.:750; I.V.:500] Out: 2150 [Urine:2150]   Physical Exam: General: Well developed, well nourished, in no acute distress. Head:  Normocephalic and atraumatic. Right IJ temporary pacemaker sheath/wire in place. Lungs: Clear to auscultation and percussion. Heart: Cardiac, regular.  No murmur, rubs or gallops. Prior bypass scar noted Abdomen: soft, non-tender, positive bowel sounds. Extremities: No clubbing or cyanosis. No edema. Neurologic: Alert and oriented x 3.    Lab Results:  Recent Labs  10/11/13 2209 10/12/13 0953 10/12/13 2333  WBC 9.6 13.2*  --   HGB 13.4 13.3 11.6*  PLT 217 210  --     Recent Labs  10/13/13 0452 10/14/13 0410  NA 141 138  K 3.6 3.9  CL 106 103  CO2 29 27  GLUCOSE 110* 126*  BUN 12 7  CREATININE 0.69 0.57    Recent Labs  10/11/13 2209  TROPONINI <0.30   Hepatic Function Panel  Recent Labs  10/11/13 2209  PROT 7.2  ALBUMIN 4.0  AST 19  ALT 23  ALKPHOS 72  BILITOT 0.6    Recent Labs  10/12/13 0400  CHOL 143   No results found for this basename: PROTIME,  in the last 72 hours  Imaging: No results found. Personally viewed.   Telemetry: Sinus bradycardia, right bundle branch block, rare PVC Personally viewed.   EKG:  Sinus bradycardia rate 55 beats  per minute, first degree AV block, right bundle branch block, left anterior fascicular block, trifascicular block.  Cardiac Studies:   Echocardiogram 07/15/13:  - Left ventricle: The cavity size was normal. There was mild focal basal hypertrophy of the septum. Systolic function was normal. The estimated ejection fraction was in the range of 55% to 60%. Wall motion was normal; there were no regional wall motion abnormalities. - Aortic valve: Mild regurgitation. - Mitral valve: Calcified annulus. Mild to moderate regurgitation. - Left atrium: The atrium was moderately dilated. - Right ventricle: The cavity size was mildly dilated. Wall thickness was normal. - Right atrium: The atrium was mildly dilated. - Pulmonary arteries: Systolic pressure was mildly increased. PA peak pressure: 34mm Hg (S).   Assessment/Plan:   72 year old male with 2 prior syncopal episodes in the setting of trifascicular block, with prior bypass surgery approximately 18 years ago in [place], who was on metoprolol ER 100 mg once a day and also had hypokalemia on admission. Currently with temporary pacemaker wire via right IJ.  1. Syncope-complete heart block previously described. 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 Of course, metoprolol and hypokalemia potentiated bradycardia however given his underlying long-standing conduction disorder, pacemaker is likely indicated. Had a second episode upon arrival in the CCU with brief syncope and complete heart block requiring temporary  pacemaker wire placement. Dopamine was utilized initially but now is off. N.p.o. now for pacemaker placement and consult electrophysiology.   2. Hypokalemia-aggressively repleted. Continue to monitor closely. Would not use two diuretics.  3. Coronary artery disease-status post bypass-stable, no evidence of angina subjectively. Feels well. On aspirin.  4.  Hyperlipidemia-continue with atorvastatin.      Valleri Hendricksen S. 10/14/2013, 8:48 AM

## 2013-10-14 NOTE — Progress Notes (Signed)
Dayna Dunn PA notified of pt's B/P  - Orders received

## 2013-10-15 DIAGNOSIS — I10 Essential (primary) hypertension: Secondary | ICD-10-CM

## 2013-10-15 LAB — BASIC METABOLIC PANEL WITH GFR
BUN: 9 mg/dL (ref 6–23)
CO2: 25 meq/L (ref 19–32)
Calcium: 9 mg/dL (ref 8.4–10.5)
Chloride: 101 meq/L (ref 96–112)
Creatinine, Ser: 0.64 mg/dL (ref 0.50–1.35)
GFR calc Af Amer: >90 mL/min (ref >90)
GFR calc non Af Amer: >90 mL/min (ref >90)
Glucose, Bld: 119 mg/dL — ABNORMAL HIGH (ref 70–99)
Potassium: 3.9 meq/L (ref 3.7–5.3)
Sodium: 139 meq/L (ref 137–147)

## 2013-10-15 MED ORDER — HYDRALAZINE HCL 10 MG PO TABS: 10.0000 mg | ORAL_TABLET | Freq: Three times a day (TID) | ORAL | Status: AC | PRN

## 2013-10-15 NOTE — Progress Notes (Signed)
Pt discharge education complete; pt & wife present at bedside; pt acknowledged understanding of discharge instructions; no further questions asked; emotional support provided; pt wheeled out to car;

## 2013-10-15 NOTE — Discharge Summary (Signed)
Discharge Summary   Patient ID: Alvin Simpson,  MRN: [id number], DOB/AGE: [birth year] 54 y.o.  Admit date: 10/11/2013 Discharge date: 10/15/2013  Primary Physician: Lolita Patella, MD Primary Cardiologist: Catalina Gravel., MD  Discharge Diagnoses Principal Problem:   Complete heart block  - Secondary to hypokalemia  - Underlying conduction disorder- RBBB, LAFB, 1st degree AVB  - Improved with beta blocker wash out, potassium replenishment  - Transient TV pacer able to be weaned prior to d/c  - To arrange LifeWatch continuous cardiac monitor tomorrow at 12:30 PM Active Problems:   Hypokalemia  - Repleted  - Lasix stopped   Syncope  - Secondary to CHB   Coronary atherosclerosis of native coronary artery  - Stable  - To resume ASA, statin   Hyperlipidemia  - To resume statin   Hypertension  - Antihypertensives adjusted due to hypokalemia, CHB  - Hydralazine 10mg  TID PRN to replace doxazosin (adverse rxn of arrhythmia)  - Metoprolol and Lasix discontinued  Allergies No Known Allergies  Diagnostic Studies/Procedures  PORTABLE CHEST X-RAY - 10/12/13  IMPRESSION:  Pacer wire via right internal jugular sheath, tip projects in right ventricle. Mild cardiomegaly and mild chronic interstitial changes in this low inspiratory portable examination.  NONCONTRAST HEAD CT - 10/12/13  IMPRESSION:  No acute intracranial abnormalities. Patchy areas of hypoattenuation along the basal ganglia and overlying white matter likely combination of chronic microvascular ischemic change and old infarcts.  History of Present Illness  Alvin Simpson is a 72 y.o. male who was admitted to [facility] 12/26 to 10/15/13 with the above problem list.  He has a history of CAD Simpson/p CABG, HTN, HLD and prostate CA.   He presented to [facility] ED on 12/26 after experiencing a syncopal episode in his kitchen after he had been standing for some time talking to his family. He had a few seconds of  lightheadedness followed by LOC x 20 seconds. He quickly regained consciousness. There was no residual confusion, tongue biting, incontinence, etc. He did not sustain injury. He subsequently presented to the [facility] ED.   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 BMET indicated a hypokalemia at 2.6. CXR revealed no acute cardiopulmonary process. Noncontrast head CT revealed chronic ischemic changes, no acute process. He was HD stable at first and transferred was arranged to [facility]. He then developed hypotension requiring the support of dopamine. He was transported urgently to [facility] CCU.   Hospital Course   Immediately upon arrival, he had another syncopal episode with concurrent CHB. A temporary pacing wire was placed via the R IJ with adequate capture. Beta blocker therapy was held and allowed to washout. Potassium was aggressively repleted via central line. All antihypertensives and AVN blockers were held.   His potassium eventually returned WNL. The combination of HCTZ and Lasix was suspected to have attributed to his hypokalemia. These were held. Complete heart block resolved with beta blocker washout. EP was consulted regarding placement of a PPM given that patient'Simpson history of underlying conduction disorder. A period of close monitoring was recommended with outpatient follow-up. Dopamine was weaned and TV PM was able to be removed with no further heart block or syncope.   He was evaluated by Dr. Eldridge Dace this AM who deemed the patient to be stable for discharge. He will be discharged on the medication changes outlined below. LifeWatch continuous heart monitoring will be arranged at the office tomorrow at 12:30 PM. He will follow-up with Dr. Eldridge Dace as scheduled below.  This information, including supplemental CHB education, has been clearly explained to the patient and outlined in the discharge AVS.   Discharge Vitals:  Blood pressure 148/100, pulse 71, temperature 99 F (37.2  C), temperature source Oral, resp. rate 19, height 5\' 7"  (1.702 m), weight 92 kg (202 lb 13.2 oz), SpO2 94.00%.   Labs: Recent Labs     10/12/13  2333  HGB  11.6*  HCT  34.0*   Recent Labs Lab 10/11/13 2209  10/13/13 0452 10/14/13 0410 10/15/13 0420  NA 138  < > 141 138 139  K 2.6*  < > 3.6 3.9 3.9  CL 95*  < > 106 103 101  CO2 32  < > 29 27 25   BUN 17  < > 12 7 9   CREATININE 0.71  < > 0.69 0.57 0.64  CALCIUM 9.3  < > 7.7* 8.4 9.0  PROT 7.2  --   --   --   --   BILITOT 0.6  --   --   --   --   ALKPHOS 72  --   --   --   --   ALT 23  --   --   --   --   AST 19  --   --   --   --   GLUCOSE 119*  < > 110* 126* 119*  < > = values in this interval not displayed.  Disposition:  Discharge Orders   Future Appointments Provider Department Dept Phone   10/16/2013 12:30 PM [facility] [place] Office [phone]   10/24/2013 1:45 PM Everette Rank, MD [facility] [phone]   Future Orders Complete By Expires   Diet - low sodium heart healthy  As directed    Increase activity slowly  As directed          Follow-up Information   Follow up with Corky Crafts., MD On 10/24/2013. (At 1:45 PM for cardiology follow-up. )    Specialty:  Cardiology   Contact information:   [address] [place] [address] [phone]       Follow up with Lolita Patella, MD. Schedule an appointment as soon as possible for a visit in 1 week. (For general post-hospital hospital follow-up, blood pressure management and potassium monitoring. )    Specialty:  Family Medicine   Contact information:   [phone] [address] [place] [address] [phone]       Follow up with [facility] On 10/16/2013. (At 12:30 PM for LifeWatch continuous heart monitor. )    Contact information:   [address] [place] [address] [phone]      Discharge  Medications:    Medication List    STOP taking these medications       doxazosin 4 MG tablet  Commonly known as:  CARDURA     furosemide 20 MG tablet  Commonly known as:  LASIX     metoprolol succinate 100 MG 24 hr tablet  Commonly known as:  TOPROL-XL      TAKE these medications       amLODipine 10 MG tablet  Commonly known as:  NORVASC  Take 10 mg by mouth daily.     aspirin 81 MG chewable tablet  Chew 81 mg by mouth daily.     atorvastatin 20 MG tablet  Commonly known as:  LIPITOR  Take 20 mg by mouth every evening.  bimatoprost 0.01 % Soln  Commonly known as:  LUMIGAN  Apply 1 drop to eye at bedtime.     COQ10 PO  Take 1 capsule by mouth every evening.     hydrALAZINE 10 MG tablet  Commonly known as:  APRESOLINE  Take 1 tablet (10 mg total) by mouth 3 (three) times daily as needed (For systolic blood pressure (top number) greater than 150).     hydrochlorothiazide 25 MG tablet  Commonly known as:  HYDRODIURIL  Take 25 mg by mouth daily.     irbesartan 300 MG tablet  Commonly known as:  AVAPRO  Take 300 mg by mouth daily.     potassium chloride SA 20 MEQ tablet  Commonly known as:  K-DUR,KLOR-CON  Take 20 mEq by mouth every Monday, Wednesday, and Friday.       Outstanding Labs/Studies: LifeWatch cardiac monitor to be arranged tomorrow at [facility] office in [place] at 12:30 PM  Duration of Discharge Encounter: 45 minutes including physician time discussing syncope, bradycardia and monitoring plan as outpatient.  Signed, R. Hurman Horn, PA-C 10/15/2013, 4:53 PM   I have examined the patient and reviewed assessment and plan and discussed with patient.  Agree with above as stated.   Please see my note for further details.  Alvin Simpson.

## 2013-10-15 NOTE — Discharge Instructions (Signed)
PLEASE TAKE ALL NEW MEDICATIONS/MEDICATION CHANGES AS PRESCRIBED.   PLEASE ATTEND ALL SCHEDULED/RECOMMENDED FOLLOW-UP APPOINTMENTS.  AVOID MEDICATIONS THAT LOWER HEART RATE. WE WILL REPLACE DOXAZOSIN WITH HYDRALAZINE AS NEEDED TO AVOID AN ADVERSE REACTION OF ARRHYTHMIA (ABNORMAL HEART RHYTHM).     Complete heart block/3rd degree AV block   Third degree atrioventricular block is a type of heart block. The heartbeat is a coordinated contraction between the upper and lower chambers of the heart. This coordinated contraction happens because of an electrical impulse that is sent from the upper chambers of the heart to the lower chambers of the heart. Normally, this electrical impulse is transmitted without problems. In third degree heart block, also known as complete heart block, the heart's electrical impulse does not pass from the upper chambers of the heart to the lower chambers of the heart. The heart's lower chambers beat independently from the upper chambers. This causes the heart to beat at a very slow rate and does not allow the heart to pump blood very well. Third degree heart block is serious and can result in death.  CAUSES  Heart attack. A heart attack can cause scarring which can damage the heart's electrical system.  Aging can cause degeneration and scarring of the heart's electrical system.  Heart medication such as beta blockers or calcium channel blockers. These kinds of medications can slow the heart rate. They can affect the electrical impulse of the heart if the dosage is too high.  Open heart surgery can affect the electrical pathway of the heart. SYMPTOMS  Symptoms may include the following:  Fainting or near fainting.  Feeling dizzy or lightheaded.  Difficulty breathing or shortness or breath.  Chest pain.  Fatigue.  Swelling of the lower legs. DIAGNOSIS  Third degree heart block can be diagnosed by:  An electrocardiogram (EKG). An EKG is a tracing of the heartbeat and can  show a 3rd degree heart block.  Electrophysiology (EP) study. This is a procedure which tests the electrical pathway of your heart. This type of test is done by a specialist who places a catheter (long thin tube) in your heart. The catheter is used to study your heart and record your heart's electrical signals. TREATMENT  3rd degree heart block requires hospitalization and continuous heart rhythm monitoring.  Most people with 3rd degree heart block will need a permanent pacemaker to help your heart beat.  If a permanent pacemaker cannot be placed immediately, a temporary pacemaker may be needed.  Heart medications such beta blockers or calcium channel blockers can slow the heart rate. Your caregiver may need to adjust your heart medication if this is the cause of your heart block. SEEK MEDICAL CARE IF:  You have unexplained fatigue.  You feel lightheaded.  You feel faint.  You feel your heart skipping beats or your heart beats very fast. SEEK IMMEDIATE MEDICAL CARE IF:  You have severe chest pain, especially if the pain is crushing or pressure-like and spreads to the arms, back, neck, or jaw. THIS IS AN EMERGENCY. Do not wait to see if the pain will go away. Get medical help at once. Call your local emergency services (911 in the [place] DO NOT drive yourself to the hospital.  You notice increasing shortness of breath during rest, sleeping, or with activity.  You "black out" or faint.  You have swelling in your lower legs. MAKE SURE YOU:  Understand these instructions.  Will watch your condition.  Will get help right away if you are  not doing well or get worse. Document Released: 09/15/2008 Document Revised: 12/26/2011 Document Reviewed: 09/15/2008  [facility] Patient Information 2014 [place], [place].

## 2013-10-15 NOTE — Progress Notes (Signed)
Subjective:  Had a good day yesterday, feels better.  Right IJ temporary pacemaker wire in place.  No rhythm events  Objective:  Vital Signs in the last 24 hours: Temp:  [98.2 F (36.8 C)-98.6 F (37 C)] 98.2 F (36.8 C) (12/30 0746) Pulse Rate:  [57-109] 89 (12/30 0800) Resp:  [10-22] 18 (12/30 0800) BP: (137-197)/(60-109) 169/87 mmHg (12/30 0800) SpO2:  [87 %-97 %] 87 % (12/30 0800) Weight:  [202 lb 13.2 oz (92 kg)] 202 lb 13.2 oz (92 kg) (12/30 0500)  Intake/Output from previous day: 12/29 0701 - 12/30 0700 In: 1330 [P.O.:840; I.V.:490] Out: 1700 [Urine:1700]   Physical Exam: General: Well developed, well nourished, in no acute distress. Head:  Normocephalic and atraumatic. Right IJ temporary pacemaker sheath/wire in place. Lungs: Clear to auscultation and percussion. Heart: Cardiac, regular.  No murmur, rubs or gallops. Prior bypass scar noted Abdomen: soft, non-tender, positive bowel sounds. Extremities: No clubbing or cyanosis. No edema. Left second toe bruised. Neurologic: Alert and oriented x 3.    Lab Results:  Recent Labs  10/12/13 2333  HGB 11.6*    Recent Labs  10/14/13 0410 10/15/13 0420  NA 138 139  K 3.9 3.9  CL 103 101  CO2 27 25  GLUCOSE 126* 119*  BUN 7 9  CREATININE 0.57 0.64   No results found for this basename: TROPONINI, CK, MB,  in the last 72 hours Hepatic Function Panel No results found for this basename: PROT, ALBUMIN, AST, ALT, ALKPHOS, BILITOT, BILIDIR, IBILI,  in the last 72 hours No results found for this basename: CHOL,  in the last 72 hours No results found for this basename: PROTIME,  in the last 72 hours  Imaging: No results found. Personally viewed.   Telemetry: Sinus bradycardia, right bundle branch block, rare PVC Personally viewed.   EKG:  Sinus bradycardia rate 55 beats per minute, first degree AV block, right bundle branch block, left anterior fascicular block, trifascicular block.  Cardiac Studies:     Echocardiogram 07/15/13:  - Left ventricle: The cavity size was normal. There was mild focal basal hypertrophy of the septum. Systolic function was normal. The estimated ejection fraction was in the range of 55% to 60%. Wall motion was normal; there were no regional wall motion abnormalities. - Aortic valve: Mild regurgitation. - Mitral valve: Calcified annulus. Mild to moderate regurgitation. - Left atrium: The atrium was moderately dilated. - Right ventricle: The cavity size was mildly dilated. Wall thickness was normal. - Right atrium: The atrium was mildly dilated. - Pulmonary arteries: Systolic pressure was mildly increased. PA peak pressure: 34mm Hg (S).   Assessment/Plan:   72 year old male with 2 prior syncopal episodes in the setting of trifascicular block, with prior bypass surgery approximately 18 years ago in [place], who was on metoprolol ER 100 mg once a day and also had hypokalemia on admission. Currently with temporary pacemaker wire via right IJ.  1. Syncope-complete heart block previously described. Conduction disorder with right bundle branch block, left anterior fascicular block, first degree AV block previously noted on metoprolol.  No recent pauses. D/w Dr. Graciela Husbands.  Pull pacer today.  Ambulate.  Would need d/c with Life watch continuous outpatient telemetry monitor.     2. Hypokalemia-aggressively repleted. Continue to monitor closely. Would not use two diuretics.  3. Coronary artery disease-status post bypass-stable, no evidence of angina subjectively. Feels well. On aspirin.  4. Hyperlipidemia-continue with atorvastatin.  HTN: Avoid rate slowing drugs.  WOuld use hydralazine  as outpatient if needed as well.  Continue amlodipine, HCTZ, irbesartan, hydralazine 10 mg PO TID prn SBP > 150 mm Hg.  Possible d/c later today.      VARANASI,JAYADEEP S. 10/15/2013, 10:12 AM

## 2013-10-16 ENCOUNTER — Encounter: Payer: Self-pay | Admitting: *Deleted

## 2013-10-16 ENCOUNTER — Other Ambulatory Visit: Payer: Self-pay | Admitting: *Deleted

## 2013-10-16 ENCOUNTER — Encounter (INDEPENDENT_AMBULATORY_CARE_PROVIDER_SITE_OTHER): Payer: Medicare Other

## 2013-10-16 DIAGNOSIS — I442 Atrioventricular block, complete: Secondary | ICD-10-CM

## 2013-10-16 DIAGNOSIS — I455 Other specified heart block: Secondary | ICD-10-CM

## 2013-10-16 DIAGNOSIS — R55 Syncope and collapse: Secondary | ICD-10-CM

## 2013-10-16 NOTE — Progress Notes (Signed)
Patient ID: Alvin Simpson, male   DOB: 1941-03-20, 72 y.o.   MRN: [id number] Lifewatch 30 day cardiac event monitor applied to patient.

## 2013-10-24 ENCOUNTER — Encounter: Payer: Self-pay | Admitting: Interventional Cardiology

## 2013-10-24 ENCOUNTER — Ambulatory Visit (INDEPENDENT_AMBULATORY_CARE_PROVIDER_SITE_OTHER): Payer: Medicare Other | Admitting: Interventional Cardiology

## 2013-10-24 VITALS — BP 148/82 | HR 90 | Ht 70.0 in | Wt 199.0 lb

## 2013-10-24 DIAGNOSIS — I2510 Atherosclerotic heart disease of native coronary artery without angina pectoris: Secondary | ICD-10-CM

## 2013-10-24 DIAGNOSIS — R55 Syncope and collapse: Secondary | ICD-10-CM

## 2013-10-24 DIAGNOSIS — I442 Atrioventricular block, complete: Secondary | ICD-10-CM

## 2013-10-24 DIAGNOSIS — I10 Essential (primary) hypertension: Secondary | ICD-10-CM

## 2013-10-24 NOTE — Progress Notes (Signed)
Patient ID: Alvin Simpson, male   DOB: 1941/01/05, 73 y.o.   MRN: [id number]    [address], [place], [place]  [address] Phone: [phone] Fax:  [phone]  Date:  10/24/2013   ID:  Alvin Simpson, DOB 22-May-1941, MRN [id number]  PCP:  Vena Austria, MD      History of Present Illness: Alvin Simpson is a 73 y.o. male  who had CABG in 1997. He had a fainting episode while in [place] on a fishing trip in 2014. He had walked 30 minutes and then [place]. It was cold and he was not dressed warmly. He was helping people onto the barge. he ha a 30 lb pack on his back. He stood up quickly and had to run up some stairs. He felt lightheaded for a second and then passed out briefly. He stood up after regaining consciousness and felt fine. No CP, SHOB, nausea, dizziness.  He had furosemide added prn. He had not taken any. He is taking ocuvite for his eyes. He had injured a wrist and needs surgery. He had an evaluation in [place]. He had a CXR and head CT. He only recalls failing one of the memory tests. CAD/ASCVD:  Highest BP reading is 165/85. Typical reading 130-135/76-80. Walks 3x/week on the treadmill for an hour. Denies : Chest pain.  Dizziness.  Dyspnea on exertion.  Leg edema.  Nitroglycerin.  Orthopnea.  Palpitations.    Hospitalized again for syncope.  Found to have complete heart block related to metoprolol.  Temp pacer placed but it was removed.  Bradycardia resolved with metoprolol being stopped.  No pacemaker placed. He was seen by Dr. Caryl Comes in consultation. BP has been high, up to the 170s.  Using hydralazine prn. Vital Signs      Wt Readings from Last 3 Encounters:  10/24/13 199 lb (90.266 kg)  10/15/13 202 lb 13.2 oz (92 kg)  06/24/12 185 lb (83.915 kg)     Past Medical History  Diagnosis Date  . Hypertension   . Cancer prostate    with seeds  . Coronary artery disease   . Coronary atherosclerosis of native coronary artery 10/13/2013      CABG-[place]-1998    Current Outpatient Prescriptions  Medication Sig Dispense Refill  . amLODipine (NORVASC) 10 MG tablet Take 10 mg by mouth daily.      Marland Kitchen aspirin 81 MG chewable tablet Chew 81 mg by mouth daily.      Marland Kitchen atorvastatin (LIPITOR) 20 MG tablet Take 20 mg by mouth every evening.      . bimatoprost (LUMIGAN) 0.01 % SOLN Apply 1 drop to eye at bedtime.      . Coenzyme Q10 (COQ10 PO) Take 1 capsule by mouth every evening.      Marland Kitchen doxazosin (CARDURA) 4 MG tablet       . hydrALAZINE (APRESOLINE) 10 MG tablet Take 1 tablet (10 mg total) by mouth 3 (three) times daily as needed (For systolic blood pressure (top number) greater than 150).  90 tablet  3  . hydrochlorothiazide (HYDRODIURIL) 25 MG tablet Take 25 mg by mouth daily.      . irbesartan (AVAPRO) 300 MG tablet Take 300 mg by mouth daily.      . potassium chloride SA (K-DUR,KLOR-CON) 20 MEQ tablet Take 20 mEq by mouth every Monday, Wednesday, and Friday.        No current facility-administered medications for this visit.  Allergies:   No Known Allergies  Social History:  The patient  reports that he quit smoking about 48 years ago. He has never used smokeless tobacco. He reports that he drinks alcohol. He reports that he does not use illicit drugs.   Family History:  The patient's family history is not on file.   ROS:  Please see the history of present illness.  No nausea, vomiting.  No fevers, chills.  No focal weakness.  No dysuria.   All other systems reviewed and negative.   PHYSICAL EXAM: VS:  BP 148/82  Pulse 90  Ht 5\' 10"  (1.778 m)  Wt 199 lb (90.266 kg)  BMI 28.55 kg/m2 Well nourished, well developed, in no acute distress HEENT: normal Neck: no JVD, no carotid bruits Cardiac:  normal S1, S2; RRR; Lungs:  clear to auscultation bilaterally, no wheezing, rhonchi or rales Abd: soft, nontender, no hepatomegaly Ext: no edema Skin: warm and dry Neuro:   no focal abnormalities noted       ASSESSMENT AND  PLAN:  HTN:  Restart Cardura 4 mg daily. Continue other meds.  BP was better controlled on metoprolol.  Now, he canot take metoprolol due to heart block.  Await results of monitor.  Minimize salt.    2. Coronary atherosclerosis of native coronary artery  Notes: no clear angina. Bypass surgeries many years ago. Prior stress test negative in 2014.   3. Syncope and collapse   Due to complete heart block on metoprolol.  Wearing monitor.  Avoid AV nodal blocking agents.      Signed, Mina Marble, MD, [facility] 10/24/2013 2:11 PM

## 2013-10-24 NOTE — Patient Instructions (Signed)
Your physician wants you to follow-up in: 3 months with Dr. Irish Lack. You will receive a reminder letter in the mail two months in advance. If you don't receive a letter, please call our office to schedule the follow-up appointment.  Restart taking Cardura 4 mg daily.

## 2013-11-22 ENCOUNTER — Telehealth: Payer: Self-pay | Admitting: Cardiology

## 2013-11-22 NOTE — Telephone Encounter (Signed)
Pt.notified

## 2013-11-22 NOTE — Telephone Encounter (Signed)
Dr. Hassell Done Interpretation of 30 day LifeStar Act3: NSR, Sinus Bradycardia. No Significant pauses. CC to Dr. Caryl Comes.

## 2014-09-03 ENCOUNTER — Other Ambulatory Visit: Payer: Self-pay | Admitting: Urology

## 2014-09-03 DIAGNOSIS — N281 Cyst of kidney, acquired: Secondary | ICD-10-CM

## 2014-10-01 ENCOUNTER — Ambulatory Visit (HOSPITAL_COMMUNITY)
Admission: RE | Admit: 2014-10-01 | Discharge: 2014-10-01 | Disposition: A | Discharge status: Home / Self Care | Payer: Medicare Other | Source: Ambulatory Visit | Attending: Urology | Admitting: Urology

## 2014-10-20 ENCOUNTER — Ambulatory Visit (HOSPITAL_COMMUNITY)
Admission: RE | Admit: 2014-10-20 | Discharge: 2014-10-20 | Disposition: A | Discharge status: Home / Self Care | Payer: Medicare Other | Source: Ambulatory Visit | Attending: Urology | Admitting: Urology

## 2014-10-20 DIAGNOSIS — K8020 Calculus of gallbladder without cholecystitis without obstruction: Secondary | ICD-10-CM | POA: Insufficient documentation

## 2014-10-20 DIAGNOSIS — N281 Cyst of kidney, acquired: Secondary | ICD-10-CM | POA: Diagnosis not present

## 2014-10-20 DIAGNOSIS — K5730 Diverticulosis of large intestine without perforation or abscess without bleeding: Secondary | ICD-10-CM | POA: Diagnosis not present

## 2014-10-20 LAB — POCT I-STAT CREATININE: Creatinine, Ser: 0.8 mg/dL (ref 0.50–1.35)

## 2014-10-20 MED ORDER — GADOBENATE DIMEGLUMINE 529 MG/ML IV SOLN
20.0000 mL | Freq: Once | INTRAVENOUS | Status: AC | PRN
  Administered 2014-10-20: 17 mL via INTRAVENOUS

## 2015-01-02 IMAGING — CT CT HEAD W/O CM
2 series · 16 of 30 positions shown, 19 images · non-contrast
Comparison: None.

CLINICAL DATA: Patient fell in his kitchen.  Questionable stroke.

EXAM:
CT HEAD WITHOUT CONTRAST
TECHNIQUE: Contiguous axial images were obtained from the base of the skull
through the vertex without intravenous contrast.

[Series 2: head w/o · axial · non-contrast · 0.48mm/px · z∈[+1347,+1462]mm · 9 of 29 slices shown, 12 images]
[im 3/29  brain]
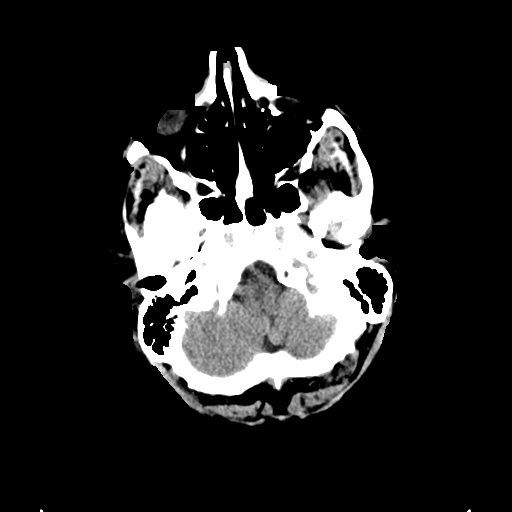
[im 3/29  bone]
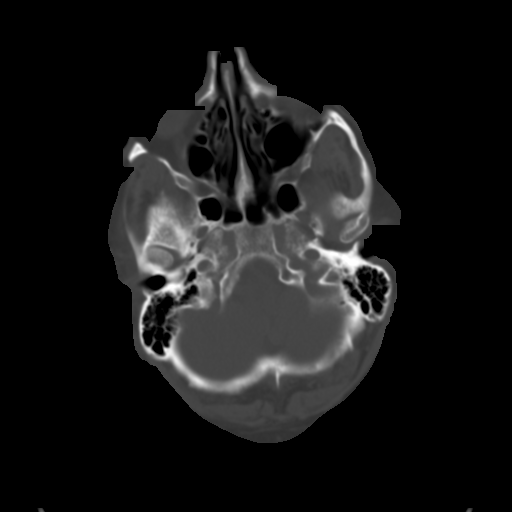
[im 6/29  brain]
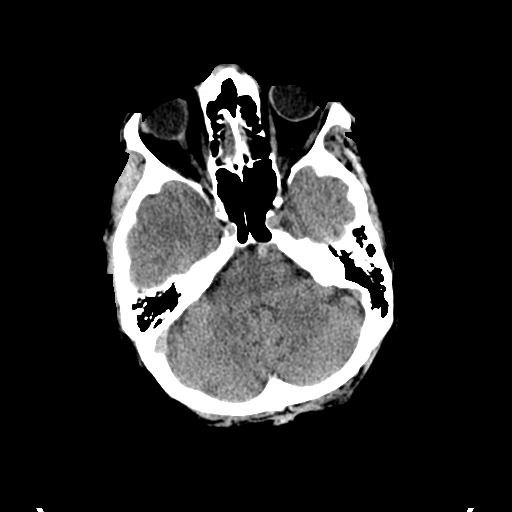
[im 9/29  brain]
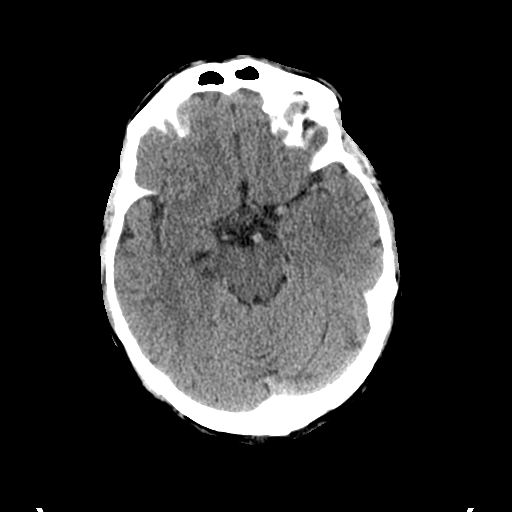
[im 12/29  brain]
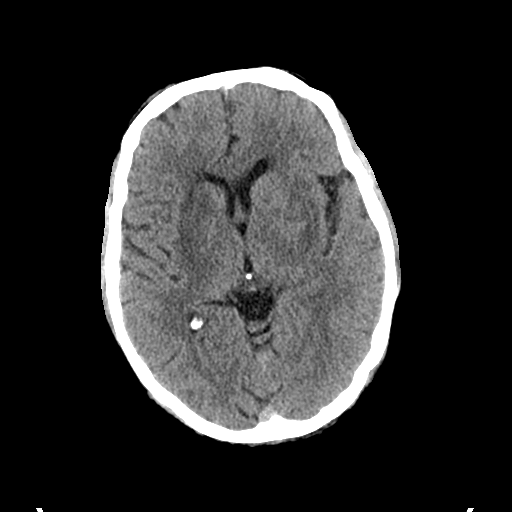
[im 15/29  brain]
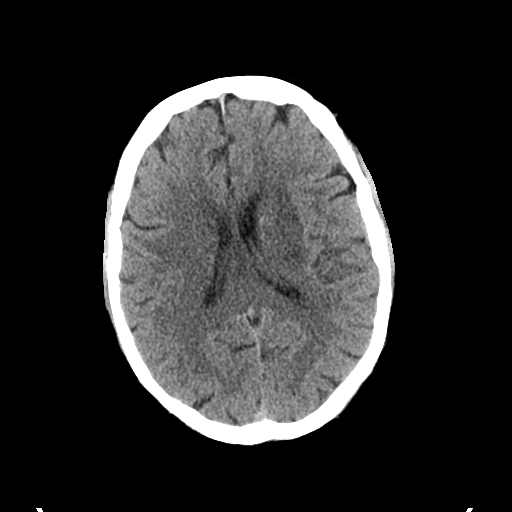
[im 15/29  bone]
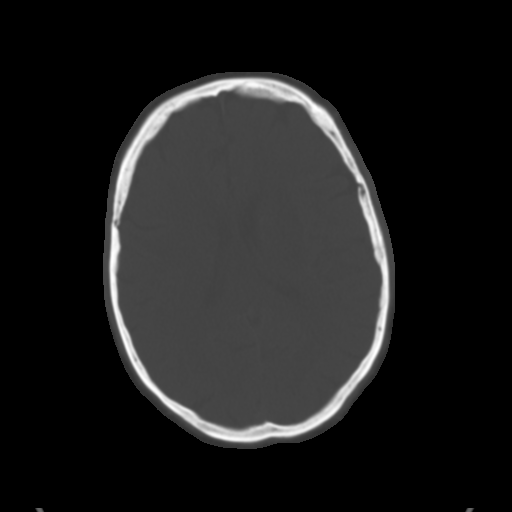
[im 17/29  brain]
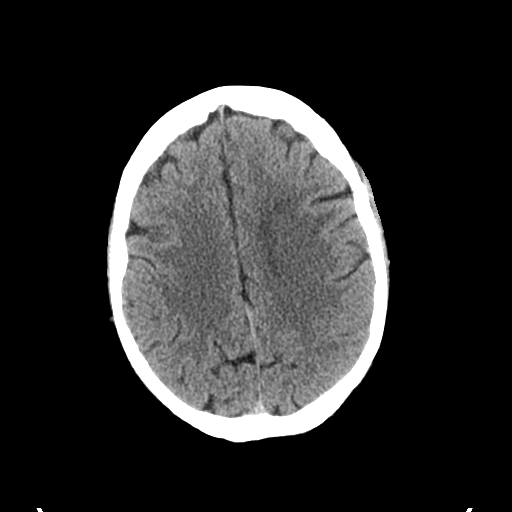
[im 20/29  brain]
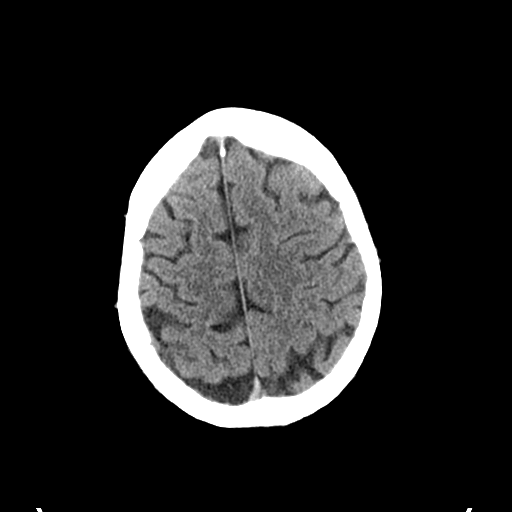
[im 23/29  brain]
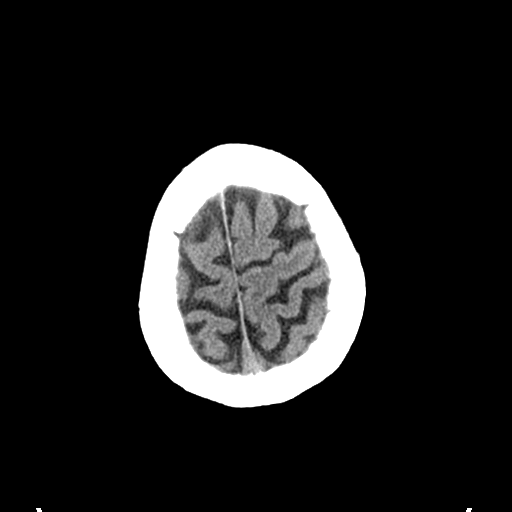
[im 26/29  brain]
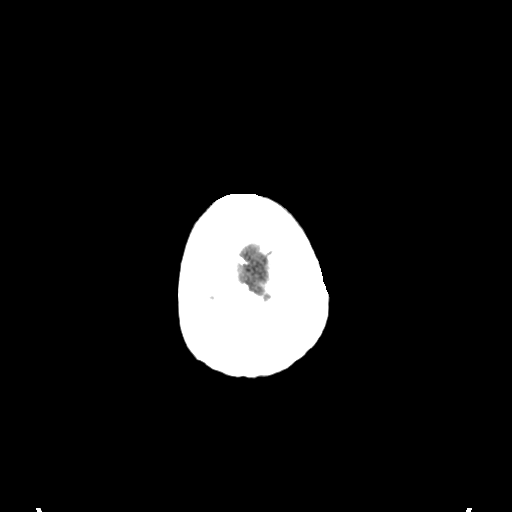
[im 26/29  bone]
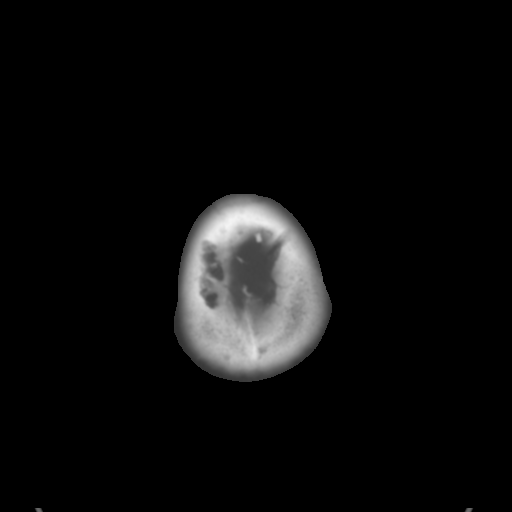

[Series 3: bone windows · axial · 0.48mm/px · z∈[+1352,+1445]mm · 7 of 48 slices shown]
[im 6/48  bone]
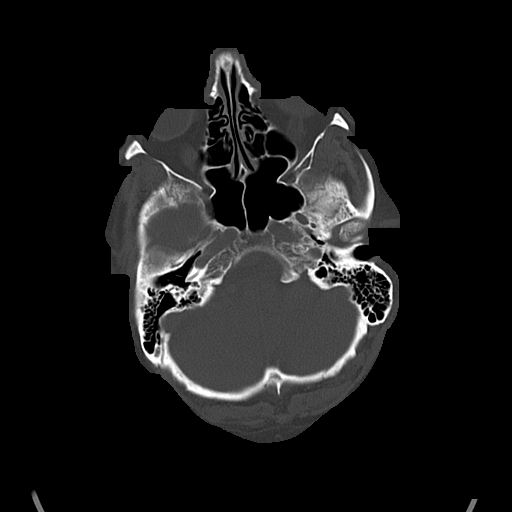
[im 11/48  bone]
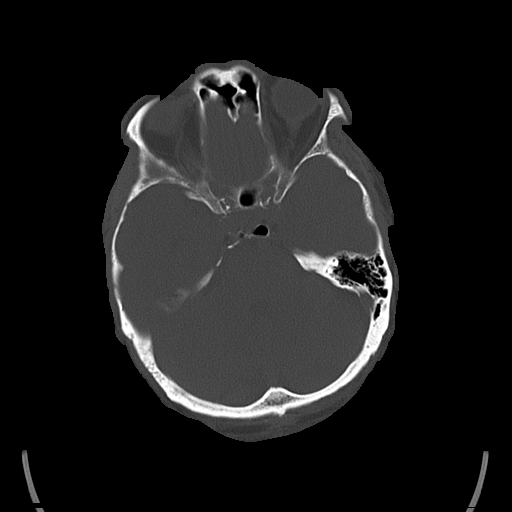
[im 16/48  bone]
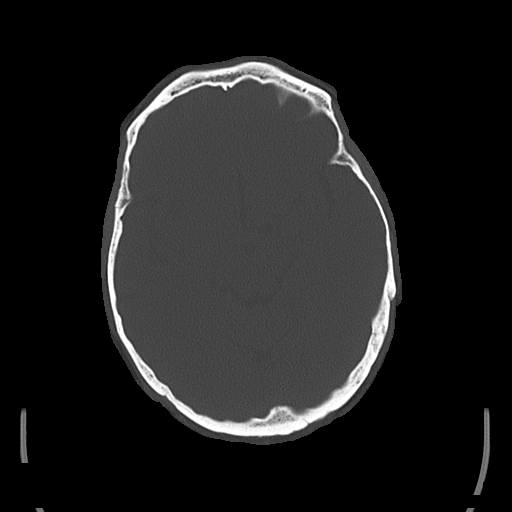
[im 21/48  bone]
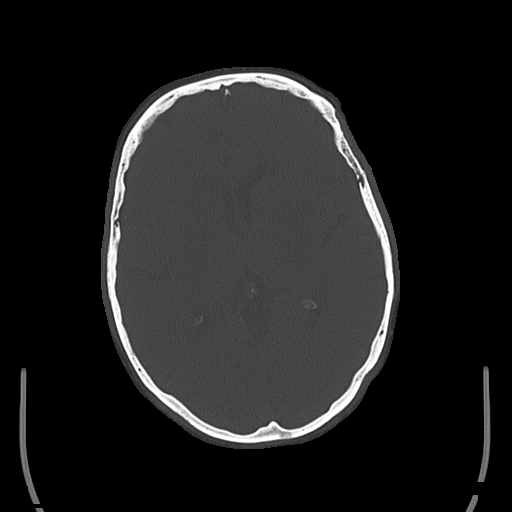
[im 27/48  bone]
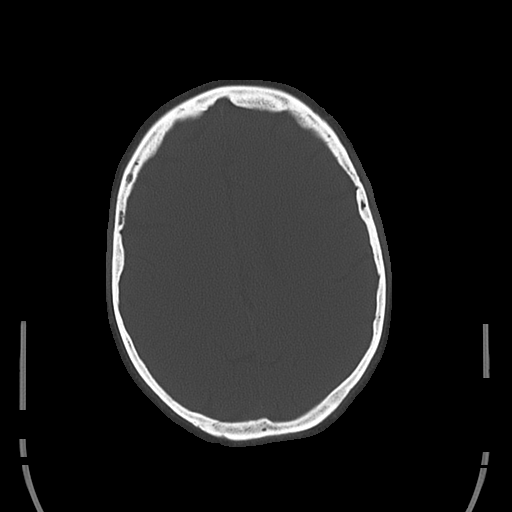
[im 32/48  bone]
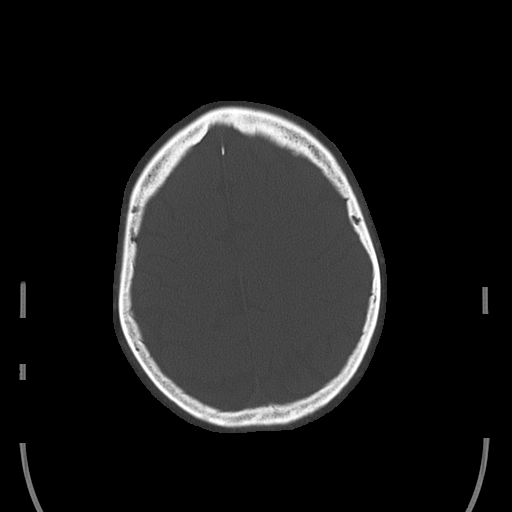
[im 37/48  bone]
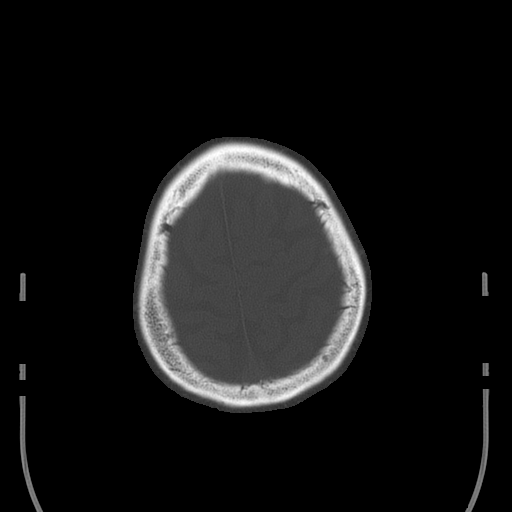

[16 of 30 positions shown; findings below may reference images not displayed]

FINDINGS: Ventricles are normal in size and configuration. There are no
parenchymal masses or mass effect. Patchy hypoattenuation is noted
along the basal ganglia extending to the adjacent centrum
semiovales. This is likely a combination of old infarcts in ischemic
change. There is no evidence of a recent cortical infarct.

There are no extra-axial masses or abnormal fluid collections.

There is no intracranial hemorrhage.

The visualized sinuses and mastoid air cells are clear.
IMPRESSION: No acute intracranial abnormalities.

Patchy areas of hypoattenuation along the basal ganglia and
overlying white matter likely combination of chronic microvascular
ischemic change and old infarcts.

## 2015-05-05 ENCOUNTER — Emergency Department (HOSPITAL_COMMUNITY)
Admission: EM | Admit: 2015-05-05 | Discharge: 2015-05-05 | Disposition: A | Discharge status: Home / Self Care | Payer: Medicare Other | Attending: Emergency Medicine | Admitting: Emergency Medicine

## 2015-05-05 ENCOUNTER — Encounter (HOSPITAL_COMMUNITY): Payer: Self-pay | Admitting: Emergency Medicine

## 2015-05-05 ENCOUNTER — Emergency Department (HOSPITAL_COMMUNITY): Payer: Medicare Other

## 2015-05-05 DIAGNOSIS — Z7982 Long term (current) use of aspirin: Secondary | ICD-10-CM | POA: Insufficient documentation

## 2015-05-05 DIAGNOSIS — Z87891 Personal history of nicotine dependence: Secondary | ICD-10-CM | POA: Insufficient documentation

## 2015-05-05 DIAGNOSIS — I10 Essential (primary) hypertension: Secondary | ICD-10-CM | POA: Diagnosis not present

## 2015-05-05 DIAGNOSIS — E876 Hypokalemia: Secondary | ICD-10-CM

## 2015-05-05 DIAGNOSIS — R0602 Shortness of breath: Secondary | ICD-10-CM | POA: Diagnosis present

## 2015-05-05 DIAGNOSIS — Z79899 Other long term (current) drug therapy: Secondary | ICD-10-CM | POA: Diagnosis not present

## 2015-05-05 DIAGNOSIS — Z859 Personal history of malignant neoplasm, unspecified: Secondary | ICD-10-CM | POA: Diagnosis not present

## 2015-05-05 DIAGNOSIS — I2510 Atherosclerotic heart disease of native coronary artery without angina pectoris: Secondary | ICD-10-CM | POA: Insufficient documentation

## 2015-05-05 DIAGNOSIS — Z9889 Other specified postprocedural states: Secondary | ICD-10-CM | POA: Insufficient documentation

## 2015-05-05 DIAGNOSIS — Z951 Presence of aortocoronary bypass graft: Secondary | ICD-10-CM | POA: Diagnosis not present

## 2015-05-05 DIAGNOSIS — R0600 Dyspnea, unspecified: Secondary | ICD-10-CM | POA: Insufficient documentation

## 2015-05-05 LAB — CBC
HEMATOCRIT: 41.6 % (ref 39.0–52.0)
HEMOGLOBIN: 14.2 g/dL (ref 13.0–17.0)
MCH: 31.5 pg (ref 26.0–34.0)
MCHC: 34.1 g/dL (ref 30.0–36.0)
MCV: 92.2 fL (ref 78.0–100.0)
PLATELETS: 216 10*3/uL (ref 150–400)
RBC: 4.51 MIL/uL (ref 4.22–5.81)
RDW: 13.2 % (ref 11.5–15.5)
WBC: 6.8 10*3/uL (ref 4.0–10.5)

## 2015-05-05 LAB — BASIC METABOLIC PANEL
Anion gap: 7 (ref 5–15)
BUN: 12 mg/dL (ref 6–20)
CO2: 29 mmol/L (ref 22–32)
CREATININE: 0.74 mg/dL (ref 0.61–1.24)
Calcium: 8.7 mg/dL — ABNORMAL LOW (ref 8.9–10.3)
Chloride: 103 mmol/L (ref 101–111)
GFR calc Af Amer: >60 mL/min (ref >60)
GLUCOSE: 112 mg/dL — AB (ref 65–99)
POTASSIUM: 3.2 mmol/L — AB (ref 3.5–5.1)
SODIUM: 139 mmol/L (ref 135–145)

## 2015-05-05 LAB — I-STAT TROPONIN, ED: TROPONIN I, POC: 0 ng/mL (ref 0.00–0.08)

## 2015-05-05 LAB — D-DIMER, QUANTITATIVE: D-Dimer, Quant: <0.27 ug/mL-FEU (ref 0.00–0.48)

## 2015-05-05 MED ORDER — POTASSIUM CHLORIDE CRYS ER 20 MEQ PO TBCR
40.0000 meq | EXTENDED_RELEASE_TABLET | Freq: Once | ORAL | Status: AC
  Administered 2015-05-05: 40 meq via ORAL
  Filled 2015-05-05: qty 2

## 2015-05-05 NOTE — ED Notes (Signed)
MD at bedside. 

## 2015-05-05 NOTE — ED Notes (Signed)
Pt. woke up this morning with SOB , denies chest pain , mild lightheadedness , no cough or congestion /no nausea or diaphoresis .

## 2015-05-05 NOTE — ED Provider Notes (Signed)
CSN: [id number]     Arrival date & time 05/05/15  0346 History   First MD Initiated Contact with Patient 05/05/15 0435     Chief Complaint  Patient presents with  . Shortness of Breath     (Consider location/radiation/quality/duration/timing/severity/associated sxs/prior Treatment) Patient is a 74 y.o. male presenting with shortness of breath. The history is provided by the patient.  Shortness of Breath He woke up from sleep at about 2 AM with severe dyspnea. His wife stated that he appeared ashen in color. Dyspnea lasted about 2 minutes before resolving. His wife stated that he had a similar episode last night which only lasted a few seconds and she thought he was just having a bad dream. He denies chest pain, heaviness, tightness, pressure. He denies cough. He denies nausea, vomiting, diarrhea. He is feeling mild dizziness. Nothing makes symptoms better nothing makes it worse. He now feels back to normal except for the mild dizziness. He denies recent travel, recent surgery, history of DVT.  Past Medical History  Diagnosis Date  . Hypertension   . Cancer prostate    with seeds  . Coronary artery disease   . Coronary atherosclerosis of native coronary artery 10/13/2013    CABG-New [id number]   Past Surgical History  Procedure Laterality Date  . Insertion prostate radiation seed    . Ankle surgery    . Coronary artery bypass graft      2  . Cardiac catheterization      18 years ago  . Eye surgery Right     cataract  . Colonoscopy    . Open reduction internal fixation (orif) distal radial fracture Left 07/06/2013    Procedure: OPEN REDUCTION INTERNAL FIXATION (ORIF) LEFT DISTAL RADIAL FRACTURE;  Surgeon: Roseanne Kaufman, MD;  Location: [place] [facility];  Service: Orthopedics;  Laterality: Left;  . Coronary artery bypass graft     No family history on file. History  Substance Use Topics  . Smoking status: Former Smoker    Quit date: 07/05/1965  . Smokeless tobacco: Never Used  . Alcohol  Use: Yes     Comment: occasional    Review of Systems  Respiratory: Positive for shortness of breath.   All other systems reviewed and are negative.     Allergies  Review of patient's allergies indicates no known allergies.  Home Medications   Prior to Admission medications   Medication Sig Start Date End Date Taking? Authorizing Provider  amLODipine (NORVASC) 10 MG tablet Take 10 mg by mouth daily.    Historical Provider, MD  aspirin 81 MG chewable tablet Chew 81 mg by mouth daily.    Historical Provider, MD  atorvastatin (LIPITOR) 20 MG tablet Take 20 mg by mouth every evening.    Historical Provider, MD  bimatoprost (LUMIGAN) 0.01 % SOLN Apply 1 drop to eye at bedtime.    Historical Provider, MD  Coenzyme Q10 (COQ10 PO) Take 1 capsule by mouth every evening.    Historical Provider, MD  doxazosin (CARDURA) 4 MG tablet Take 4 mg by mouth daily.  09/23/13   Historical Provider, MD  hydrALAZINE (APRESOLINE) 10 MG tablet Take 1 tablet (10 mg total) by mouth 3 (three) times daily as needed (For systolic blood pressure (top number) greater than 150). 10/15/13   Roger A Arguello, PA-C  hydrochlorothiazide (HYDRODIURIL) 25 MG tablet Take 25 mg by mouth daily.    Historical Provider, MD  irbesartan (AVAPRO) 300 MG tablet Take 300 mg by mouth daily.  Historical Provider, MD  potassium chloride SA (K-DUR,KLOR-CON) 20 MEQ tablet Take 20 mEq by mouth every Monday, Wednesday, and Friday.  06/25/13   Historical Provider, MD   BP 126/64 mmHg  Pulse 55  Temp(Src) 97.9 F (36.6 C)  Resp 13  Ht 5\' 10"  (1.778 m)  Wt 186 lb (84.369 kg)  BMI 26.69 kg/m2  SpO2 97% Physical Exam  Nursing note and vitals reviewed.  74 year old male, resting comfortably and in no acute distress. Vital signs are significant for borderline bradycardia. Oxygen saturation is 97%, which is normal. Head is normocephalic and atraumatic. PERRLA, EOMI. Oropharynx is clear. Neck is nontender and supple without adenopathy or  JVD. There are no carotid bruits. Back is nontender and there is no CVA tenderness. Lungs are clear without rales, wheezes, or rhonchi. Chest is nontender. Heart has regular rate and rhythm without murmur. Abdomen is soft, flat, nontender without masses or hepatosplenomegaly and peristalsis is normoactive. Extremities have no cyanosis or edema, full range of motion is present. Skin is warm and dry without rash. Neurologic: Mental status is normal, cranial nerves are intact, there are no motor or sensory deficits.  ED Course  Procedures (including critical care time) Labs Review Results for orders placed or performed during the hospital encounter of 53/61/44  Basic metabolic panel  Result Value Ref Range   Sodium 139 135 - 145 mmol/L   Potassium 3.2 (L) 3.5 - 5.1 mmol/L   Chloride 103 101 - 111 mmol/L   CO2 29 22 - 32 mmol/L   Glucose, Bld 112 (H) 65 - 99 mg/dL   BUN 12 6 - 20 mg/dL   Creatinine, Ser 0.74 0.61 - 1.24 mg/dL   Calcium 8.7 (L) 8.9 - 10.3 mg/dL   GFR calc non Af Amer >60 >60 mL/min   GFR calc Af Amer >60 >60 mL/min   Anion gap 7 5 - 15  CBC  Result Value Ref Range   WBC 6.8 4.0 - 10.5 K/uL   RBC 4.51 4.22 - 5.81 MIL/uL   Hemoglobin 14.2 13.0 - 17.0 g/dL   HCT 41.6 39.0 - 52.0 %   MCV 92.2 78.0 - 100.0 fL   MCH 31.5 26.0 - 34.0 pg   MCHC 34.1 30.0 - 36.0 g/dL   RDW 13.2 11.5 - 15.5 %   Platelets 216 150 - 400 K/uL  D-dimer, quantitative  Result Value Ref Range   D-Dimer, Quant <0.27 0.00 - 0.48 ug/mL-FEU  I-stat troponin, ED  Result Value Ref Range   Troponin i, poc 0.00 0.00 - 0.08 ng/mL   Comment 3           Imaging Review Dg Chest 2 View  05/05/2015   CLINICAL DATA:  Acute onset of shortness of breath and weakness. Initial encounter.  EXAM: CHEST  2 VIEW  COMPARISON:  Chest radiograph from 10/12/2013  FINDINGS: The lungs are well-aerated. Peribronchial thickening is noted. There is no evidence of focal opacification, pleural effusion or pneumothorax.  The  heart is borderline normal in size. The patient is status post median sternotomy. No acute osseous abnormalities are seen.  IMPRESSION: Peribronchial thickening noted; lungs otherwise clear.   Electronically Signed   By: Garald Balding M.D.   On: 05/05/2015 05:25     EKG Interpretation   Date/Time:  Tuesday May 05 2015 03:48:26 EDT Ventricular Rate:  68 PR Interval:  190 QRS Duration: 178 QT Interval:  464 QTC Calculation: 493 R Axis:   -46 Text Interpretation:  Normal sinus rhythm Right bundle branch block Left  anterior fascicular block ** Bifascicular block ** T wave abnormality,  consider lateral ischemia Abnormal ECG When compared with ECG of  10/14/2013, HEART RATE has decreased Confirmed by [facility]  MD, Selim Durden ([id number])  on 05/05/2015 4:36:53 AM      MDM   Final diagnoses:  Dyspnea  Hypokalemia    Dyspnea of uncertain cause. Transient nature dyspnea will make evaluation difficult. ECG is unchanged from baseline. Although he has no risk factors, will screen for pulmonary embolism with d-dimer. Old records are reviewed and he has been evaluated by cardiology for syncope which was secondary to beta blocker use. He is status post coronary bypass with no recurrent angina symptoms.  Workup was negative including negative d-dimer. He has had no recurrence of symptoms in the ED. Incidental finding of hypokalemia was treated with a dose of oral potassium. I have discussed this with the patient and he is on a potassium supplementation at home. Contrary to the report and home medications, he is actually taking 20 mEq twice a day. Rather than just increase his baseline potassium, I feel he probably should be considered for switching to a potassium sparing and diaphoretic. I've asked him to discuss this with his PCP.  Delora Fuel, MD 22/63/33 [id number]

## 2015-05-05 NOTE — Discharge Instructions (Signed)
Your tests in the ED did not show a cause for shortness of breath you had at home. Please follow-up with your PCP for further evaluation. Also, please talk with your PCP about your low potassium. You are ready taking a fair amount of potassium at home. He may benefit from being switched from hydrochlorothiazide to a combination pill which includes something to keep you from losing potassium.  Shortness of Breath Shortness of breath means you have trouble breathing. It could also mean that you have a medical problem. You should get immediate medical care for shortness of breath. CAUSES   Not enough oxygen in the air such as with high altitudes or a smoke-filled room.  Certain lung diseases, infections, or problems.  Heart disease or conditions, such as angina or heart failure.  Low red blood cells (anemia).  Poor physical fitness, which can cause shortness of breath when you exercise.  Chest or back injuries or stiffness.  Being overweight.  Smoking.  Anxiety, which can make you feel like you are not getting enough air. DIAGNOSIS  Serious medical problems can often be found during your physical exam. Tests may also be done to determine why you are having shortness of breath. Tests may include:  Chest X-rays.  Lung function tests.  Blood tests.  An electrocardiogram (ECG).  An ambulatory electrocardiogram. An ambulatory ECG records your heartbeat patterns over a 24-hour period.  Exercise testing.  A transthoracic echocardiogram (TTE). During echocardiography, sound waves are used to evaluate how blood flows through your heart.  A transesophageal echocardiogram (TEE).  Imaging scans. Your health care provider may not be able to find a cause for your shortness of breath after your exam. In this case, it is important to have a follow-up exam with your health care provider as directed.  TREATMENT  Treatment for shortness of breath depends on the cause of your symptoms and can  vary greatly. HOME CARE INSTRUCTIONS   Do not smoke. Smoking is a common cause of shortness of breath. If you smoke, ask for help to quit.  Avoid being around chemicals or things that may bother your breathing, such as paint fumes and dust.  Rest as needed. Slowly resume your usual activities.  If medicines were prescribed, take them as directed for the full length of time directed. This includes oxygen and any inhaled medicines.  Keep all follow-up appointments as directed by your health care provider. SEEK MEDICAL CARE IF:   Your condition does not improve in the time expected.  You have a hard time doing your normal activities even with rest.  You have any new symptoms. SEEK IMMEDIATE MEDICAL CARE IF:   Your shortness of breath gets worse.  You feel light-headed, faint, or develop a cough not controlled with medicines.  You start coughing up blood.  You have pain with breathing.  You have chest pain or pain in your arms, shoulders, or abdomen.  You have a fever.  You are unable to walk up stairs or exercise the way you normally do. MAKE SURE YOU:  Understand these instructions.  Will watch your condition.  Will get help right away if you are not doing well or get worse. Document Released: 06/28/2001 Document Revised: 10/08/2013 Document Reviewed: 12/19/2011 [facility] Patient Information 2015 [place], [place]. This information is not intended to replace advice given to you by your health care provider. Make sure you discuss any questions you have with your health care provider.   Hypokalemia Hypokalemia means that the amount of  potassium in the blood is lower than normal.Potassium is a chemical, called an electrolyte, that helps regulate the amount of fluid in the body. It also stimulates muscle contraction and helps nerves function properly.Most of the body's potassium is inside of cells, and only a very small amount is in the blood. Because the amount in the blood is  so small, minor changes can be life-threatening. CAUSES  Antibiotics.  Diarrhea or vomiting.  Using laxatives too much, which can cause diarrhea.  Chronic kidney disease.  Water pills (diuretics).  Eating disorders (bulimia).  Low magnesium level.  Sweating a lot. SIGNS AND SYMPTOMS  Weakness.  Constipation.  Fatigue.  Muscle cramps.  Mental confusion.  Skipped heartbeats or irregular heartbeat (palpitations).  Tingling or numbness. DIAGNOSIS  Your health care provider can diagnose hypokalemia with blood tests. In addition to checking your potassium level, your health care provider may also check other lab tests. TREATMENT Hypokalemia can be treated with potassium supplements taken by mouth or adjustments in your current medicines. If your potassium level is very low, you may need to get potassium through a vein (IV) and be monitored in the hospital. A diet high in potassium is also helpful. Foods high in potassium are:  Nuts, such as peanuts and pistachios.  Seeds, such as sunflower seeds and pumpkin seeds.  Peas, lentils, and lima beans.  Whole grain and bran cereals and breads.  Fresh fruit and vegetables, such as apricots, avocado, bananas, cantaloupe, kiwi, oranges, tomatoes, asparagus, and potatoes.  Orange and tomato juices.  Red meats.  Fruit yogurt. HOME CARE INSTRUCTIONS  Take all medicines as prescribed by your health care provider.  Maintain a healthy diet by including nutritious food, such as fruits, vegetables, nuts, whole grains, and lean meats.  If you are taking a laxative, be sure to follow the directions on the label. SEEK MEDICAL CARE IF:  Your weakness gets worse.  You feel your heart pounding or racing.  You are vomiting or having diarrhea.  You are diabetic and having trouble keeping your blood glucose in the normal range. SEEK IMMEDIATE MEDICAL CARE IF:  You have chest pain, shortness of breath, or dizziness.  You are  vomiting or having diarrhea for more than 2 days.  You faint. MAKE SURE YOU:   Understand these instructions.  Will watch your condition.  Will get help right away if you are not doing well or get worse. Document Released: 10/03/2005 Document Revised: 07/24/2013 Document Reviewed: 04/05/2013 [facility] Patient Information 2015 [place], [place]. This information is not intended to replace advice given to you by your health care provider. Make sure you discuss any questions you have with your health care provider.

## 2015-09-22 ENCOUNTER — Inpatient Hospital Stay (HOSPITAL_COMMUNITY)
Admission: EM | Admit: 2015-09-22 | Discharge: 2015-09-24 | DRG: 244 | Disposition: A | Discharge status: Home / Self Care | Payer: Medicare Other | Attending: Cardiovascular Disease | Admitting: Cardiovascular Disease

## 2015-09-22 ENCOUNTER — Encounter (HOSPITAL_COMMUNITY): Payer: Self-pay | Admitting: Emergency Medicine

## 2015-09-22 DIAGNOSIS — Z79899 Other long term (current) drug therapy: Secondary | ICD-10-CM

## 2015-09-22 DIAGNOSIS — Z7982 Long term (current) use of aspirin: Secondary | ICD-10-CM

## 2015-09-22 DIAGNOSIS — I442 Atrioventricular block, complete: Secondary | ICD-10-CM | POA: Diagnosis present

## 2015-09-22 DIAGNOSIS — Z951 Presence of aortocoronary bypass graft: Secondary | ICD-10-CM | POA: Diagnosis not present

## 2015-09-22 DIAGNOSIS — I10 Essential (primary) hypertension: Secondary | ICD-10-CM | POA: Diagnosis present

## 2015-09-22 DIAGNOSIS — Z959 Presence of cardiac and vascular implant and graft, unspecified: Secondary | ICD-10-CM

## 2015-09-22 DIAGNOSIS — E876 Hypokalemia: Secondary | ICD-10-CM | POA: Diagnosis present

## 2015-09-22 DIAGNOSIS — Z87891 Personal history of nicotine dependence: Secondary | ICD-10-CM

## 2015-09-22 DIAGNOSIS — I2510 Atherosclerotic heart disease of native coronary artery without angina pectoris: Secondary | ICD-10-CM | POA: Diagnosis present

## 2015-09-22 DIAGNOSIS — R55 Syncope and collapse: Secondary | ICD-10-CM | POA: Diagnosis not present

## 2015-09-22 HISTORY — DX: Atrioventricular block, complete: I44.2

## 2015-09-22 LAB — CBC WITH DIFFERENTIAL/PLATELET
BASOS PCT: 0 %
Basophils Absolute: 0 10*3/uL (ref 0.0–0.1)
Eosinophils Absolute: 0.2 10*3/uL (ref 0.0–0.7)
Eosinophils Relative: 2 %
HCT: 41.5 % (ref 39.0–52.0)
Hemoglobin: 14 g/dL (ref 13.0–17.0)
Lymphocytes Relative: 35 %
Lymphs Abs: 3.7 10*3/uL (ref 0.7–4.0)
MCH: 31.7 pg (ref 26.0–34.0)
MCHC: 33.7 g/dL (ref 30.0–36.0)
MCV: 94.1 fL (ref 78.0–100.0)
Monocytes Absolute: 0.9 10*3/uL (ref 0.1–1.0)
Monocytes Relative: 8 %
NEUTROS PCT: 55 %
Neutro Abs: 5.8 10*3/uL (ref 1.7–7.7)
Platelets: 246 10*3/uL (ref 150–400)
RBC: 4.41 MIL/uL (ref 4.22–5.81)
RDW: 13.1 % (ref 11.5–15.5)
WBC: 10.6 10*3/uL — ABNORMAL HIGH (ref 4.0–10.5)

## 2015-09-22 LAB — I-STAT CG4 LACTIC ACID, ED: Lactic Acid, Venous: 1.59 mmol/L (ref 0.5–2.0)

## 2015-09-22 LAB — I-STAT CHEM 8, ED
BUN: 16 mg/dL (ref 6–20)
Calcium, Ion: 1.07 mmol/L — ABNORMAL LOW (ref 1.13–1.30)
Chloride: 100 mmol/L — ABNORMAL LOW (ref 101–111)
Creatinine, Ser: 0.9 mg/dL (ref 0.61–1.24)
GLUCOSE: 122 mg/dL — AB (ref 65–99)
HCT: 42 % (ref 39.0–52.0)
Hemoglobin: 14.3 g/dL (ref 13.0–17.0)
Potassium: 3 mmol/L — ABNORMAL LOW (ref 3.5–5.1)
SODIUM: 142 mmol/L (ref 135–145)
TCO2: 28 mmol/L (ref 0–100)

## 2015-09-22 LAB — COMPREHENSIVE METABOLIC PANEL
ALBUMIN: 4.3 g/dL (ref 3.5–5.0)
ALK PHOS: 68 U/L (ref 38–126)
ALT: 22 U/L (ref 17–63)
ANION GAP: 9 (ref 5–15)
AST: 20 U/L (ref 15–41)
BUN: 17 mg/dL (ref 6–20)
CALCIUM: 9 mg/dL (ref 8.9–10.3)
CHLORIDE: 103 mmol/L (ref 101–111)
CO2: 29 mmol/L (ref 22–32)
Creatinine, Ser: 0.88 mg/dL (ref 0.61–1.24)
GFR calc non Af Amer: >60 mL/min (ref >60)
GLUCOSE: 126 mg/dL — AB (ref 65–99)
Potassium: 3.1 mmol/L — ABNORMAL LOW (ref 3.5–5.1)
SODIUM: 141 mmol/L (ref 135–145)
Total Bilirubin: 0.7 mg/dL (ref 0.3–1.2)
Total Protein: 7.2 g/dL (ref 6.5–8.1)

## 2015-09-22 LAB — TROPONIN I: Troponin I: <0.03 ng/mL (ref <0.031)

## 2015-09-22 LAB — MAGNESIUM: Magnesium: 2 mg/dL (ref 1.7–2.4)

## 2015-09-22 LAB — BRAIN NATRIURETIC PEPTIDE: B Natriuretic Peptide: 35.5 pg/mL (ref 0.0–100.0)

## 2015-09-22 MED ORDER — ATROPINE SULFATE 0.1 MG/ML IJ SOLN
INTRAMUSCULAR | Status: AC
  Administered 2015-09-22: 1 mg
  Filled 2015-09-22: qty 10

## 2015-09-22 MED ORDER — ATROPINE SULFATE 1 MG/ML IJ SOLN
1.0000 mg | Freq: Once | INTRAMUSCULAR | Status: AC
  Administered 2015-09-22: 1 mg via INTRAVENOUS
  Filled 2015-09-22: qty 1

## 2015-09-22 NOTE — ED Notes (Signed)
Pt reports losing consciousness approximately 7 minutes before arriving to ED. Denies chest pain/SOB/fatigue/dizziness/any other symptoms prior to syncopal episode. Is on 1+ beta blockers. This has happened before previously d/t medication change. Wife reports he "started shaking and his tongue went to the side." Pt is A&Ox4. No neurological deficit. Denies confusion/fatigue. MD Laneta Simmers at bedside.

## 2015-09-22 NOTE — ED Notes (Addendum)
Heart rate = 73 after first dose of atropine.  Patient placed on bedside monitor and Zoll pads.  Repeat EKG given to Dr. Laneta Simmers.   Second atropine dose held - Dr. Laneta Simmers notified.  Patient remains asymptomatic - no complaints of chest pain, headache, dizziness, SHOB.

## 2015-09-22 NOTE — ED Provider Notes (Signed)
CSN: [id number]     Arrival date & time 09/22/15  2050 History   First MD Initiated Contact with Patient 09/22/15 2118     Chief Complaint  Patient presents with  . Loss of Consciousness  . Bradycardia  . Heart Block      (Consider location/radiation/quality/duration/timing/severity/associated sxs/prior Treatment) Patient is a 74 y.o. male presenting with syncope. The history is provided by the patient.  Loss of Consciousness Episode history:  Single Most recent episode:  Today Duration:  1 minute Timing:  Sporadic Progression:  Resolved Chronicity:  Recurrent Context comment:  Sitting in the car Witnessed: yes   Relieved by:  Nothing Worsened by:  Nothing tried Ineffective treatments:  None tried Associated symptoms: no chest pain, no confusion, no diaphoresis, no difficulty breathing, no fever, no focal weakness, no palpitations, no shortness of breath and no vomiting     Past Medical History  Diagnosis Date  . Hypertension   . Cancer [facility]) prostate    with seeds  . Coronary artery disease   . Coronary atherosclerosis of native coronary artery 10/13/2013    CABG-New [id number]   Past Surgical History  Procedure Laterality Date  . Insertion prostate radiation seed    . Ankle surgery    . Coronary artery bypass graft      2  . Cardiac catheterization      18 years ago  . Eye surgery Right     cataract  . Colonoscopy    . Open reduction internal fixation (orif) distal radial fracture Left 07/06/2013    Procedure: OPEN REDUCTION INTERNAL FIXATION (ORIF) LEFT DISTAL RADIAL FRACTURE;  Surgeon: Roseanne Kaufman, MD;  Location: [place];  Service: Orthopedics;  Laterality: Left;  . Coronary artery bypass graft     History reviewed. No pertinent family history. Social History  Substance Use Topics  . Smoking status: Former Smoker    Quit date: 07/05/1965  . Smokeless tobacco: Never Used  . Alcohol Use: Yes     Comment: occasional    Review of Systems  Constitutional:  Negative for fever and diaphoresis.  Respiratory: Negative for shortness of breath.   Cardiovascular: Positive for syncope. Negative for chest pain and palpitations.  Gastrointestinal: Negative for vomiting.  Neurological: Negative for focal weakness.  Psychiatric/Behavioral: Negative for confusion.  All other systems reviewed and are negative.     Allergies  Review of patient's allergies indicates no known allergies.  Home Medications   Prior to Admission medications   Medication Sig Start Date End Date Taking? Authorizing Provider  amLODipine (NORVASC) 10 MG tablet Take 10 mg by mouth daily.    Historical Provider, MD  aspirin 81 MG chewable tablet Chew 81 mg by mouth daily.    Historical Provider, MD  atorvastatin (LIPITOR) 20 MG tablet Take 20 mg by mouth every evening.    Historical Provider, MD  bimatoprost (LUMIGAN) 0.01 % SOLN Apply 1 drop to eye at bedtime.    Historical Provider, MD  Coenzyme Q10 (COQ10 PO) Take 1 capsule by mouth every evening.    Historical Provider, MD  doxazosin (CARDURA) 4 MG tablet Take 4 mg by mouth daily.  09/23/13   Historical Provider, MD  hydrALAZINE (APRESOLINE) 10 MG tablet Take 1 tablet (10 mg total) by mouth 3 (three) times daily as needed (For systolic blood pressure (top number) greater than 150). 10/15/13   Roger A Arguello, PA-C  hydrochlorothiazide (HYDRODIURIL) 25 MG tablet Take 25 mg by mouth daily.    Historical Provider,  MD  irbesartan (AVAPRO) 300 MG tablet Take 300 mg by mouth daily.    Historical Provider, MD  potassium chloride SA (K-DUR,KLOR-CON) 20 MEQ tablet Take 20 mEq by mouth every Monday, Wednesday, and Friday.  06/25/13   Historical Provider, MD   BP 138/88 mmHg  Pulse 73  Temp(Src) 97.9 F (36.6 C) (Oral)  Resp 14  SpO2 92% Physical Exam  Constitutional: He is oriented to person, place, and time. He appears well-developed and well-nourished. No distress.  HENT:  Head: Normocephalic and atraumatic.  Eyes: Conjunctivae  are normal.  Neck: Neck supple. No tracheal deviation present.  Cardiovascular: Regular rhythm and normal heart sounds.  Bradycardia present.   Pulmonary/Chest: Effort normal. No respiratory distress.  Abdominal: Soft. He exhibits no distension.  Neurological: He is alert and oriented to person, place, and time. He has normal strength. GCS eye subscore is 4. GCS verbal subscore is 5. GCS motor subscore is 6.  Skin: Skin is warm and dry.  Psychiatric: He has a normal mood and affect.    ED Course  Procedures (including critical care time)  CRITICAL CARE Performed by: Leo Grosser Total critical care time: 30 minutes Critical care time was exclusive of separately billable procedures and treating other patients. Critical care was necessary to treat or prevent imminent or life-threatening deterioration. Critical care was time spent personally by me on the following activities: development of treatment plan with patient and/or surrogate as well as nursing, discussions with consultants, evaluation of patient's response to treatment, examination of patient, obtaining history from patient or surrogate, ordering and performing treatments and interventions, ordering and review of laboratory studies, ordering and review of radiographic studies, pulse oximetry and re-evaluation of patient's condition.   Labs Review Labs Reviewed  CBC WITH DIFFERENTIAL/PLATELET - Abnormal; Notable for the following:    WBC 10.6 (*)    All other components within normal limits  COMPREHENSIVE METABOLIC PANEL - Abnormal; Notable for the following:    Potassium 3.1 (*)    Glucose, Bld 126 (*)    All other components within normal limits  I-STAT CHEM 8, ED - Abnormal; Notable for the following:    Potassium 3.0 (*)    Chloride 100 (*)    Glucose, Bld 122 (*)    Calcium, Ion 1.07 (*)    All other components within normal limits  MRSA PCR SCREENING  MAGNESIUM  TROPONIN I  BRAIN NATRIURETIC PEPTIDE  TSH  BASIC  METABOLIC PANEL  CBC  I-STAT CG4 LACTIC ACID, ED    Imaging Review No results found. I have personally reviewed and evaluated these images and lab results as part of my medical decision-making.   EKG Interpretation   Date/Time:  Tuesday September 22 2015 21:12:59 EST Ventricular Rate:  30 PR Interval:    QRS Duration: 180 QT Interval:  601 QTC Calculation: 424 R Axis:   -31 Text Interpretation:  Complete AV block with wide QRS complex Right bundle  branch block Baseline wander in lead(s) I III aVL aVF Confirmed by Jenyfer Trawick  MD, Daymond Cordts [phone]) on 09/22/2015 9:23:08 PM    EKG Interpretation  Date/Time:  Tuesday September 22 2015 21:45:59 EST Ventricular Rate:  72 PR Interval:  203 QRS Duration: 189 QT Interval:  446 QTC Calculation: 488 R Axis:   -46 Text Interpretation:  Sinus rhythm RBBB and LAFB Probable left ventricular hypertrophy Baseline wander in lead(s) V4 Compared to previous tracing is now Sinus rhythm Confirmed by Ramal Eckhardt MD, Airelle Everding [id number]) on 09/23/2015 1:48:34 AM  MDM   Final diagnoses:  Complete heart block ([place])    74 y.o. male with h/a CAD s/p CABG presents with recurrent syncope episode and complete heart block previously attributed to hypkalemia and beta blocker use. Pt currently not on AV nodal blocking agents. Asymptomatic on arrival with complete block and RBBB morphology similar to previous EKGs with consideration for slow junctional escape rhythm. BP holding despite bradycardia. Cardiology consulted and accepted to ICU at [facility] by Dr Philbert Riser. K is 3.1 but not significantly depressed. Given atropine with initial conversion to sinus rhythm, had recurrent syncopal episode witnessed secondary to refractory bradycardia, this time unresponsive to atropine administration. Pt recovered his mentation and was again asymptomatic, BP lower than sinus rhythm but MAP maintained and suspect no insult to cerebral perfusion currently. Holding further atropine admin as Pt  beginning to have side effects, pacing pads are connected and dopamine available if Pt becomes hypotensive. Continuously monitored and transferred in critical condition to ICU.     Leo Grosser, MD 09/23/15 [phone]

## 2015-09-22 NOTE — ED Notes (Signed)
Patient awake, alert, responding to commands.

## 2015-09-22 NOTE — ED Notes (Signed)
Patient had LOC lasting approx 5 seconds. Patient unresponsive to painful stimulus.  Dr. Laneta Simmers at bedside and second dose of atropine given.

## 2015-09-22 NOTE — ED Notes (Signed)
MD - Dr. Laneta Simmers at bedside.

## 2015-09-23 ENCOUNTER — Inpatient Hospital Stay (HOSPITAL_COMMUNITY): Payer: Medicare Other

## 2015-09-23 ENCOUNTER — Encounter (HOSPITAL_COMMUNITY): Payer: Self-pay | Admitting: Physician Assistant

## 2015-09-23 ENCOUNTER — Encounter (HOSPITAL_COMMUNITY)
Admission: EM | Disposition: A | Discharge status: Home / Self Care | Payer: Medicare Other | Source: Home / Self Care | Attending: Cardiovascular Disease

## 2015-09-23 DIAGNOSIS — I442 Atrioventricular block, complete: Secondary | ICD-10-CM

## 2015-09-23 DIAGNOSIS — I10 Essential (primary) hypertension: Secondary | ICD-10-CM

## 2015-09-23 DIAGNOSIS — E876 Hypokalemia: Secondary | ICD-10-CM

## 2015-09-23 DIAGNOSIS — R55 Syncope and collapse: Secondary | ICD-10-CM

## 2015-09-23 DIAGNOSIS — I2510 Atherosclerotic heart disease of native coronary artery without angina pectoris: Secondary | ICD-10-CM

## 2015-09-23 HISTORY — DX: Atrioventricular block, complete: I44.2

## 2015-09-23 HISTORY — PX: EP IMPLANTABLE DEVICE: SHX172B

## 2015-09-23 LAB — CBC
HCT: 40.8 % (ref 39.0–52.0)
HEMOGLOBIN: 13.9 g/dL (ref 13.0–17.0)
MCH: 32 pg (ref 26.0–34.0)
MCHC: 34.1 g/dL (ref 30.0–36.0)
MCV: 94 fL (ref 78.0–100.0)
Platelets: 215 10*3/uL (ref 150–400)
RBC: 4.34 MIL/uL (ref 4.22–5.81)
RDW: 13.2 % (ref 11.5–15.5)
WBC: 10.2 10*3/uL (ref 4.0–10.5)

## 2015-09-23 LAB — BASIC METABOLIC PANEL
ANION GAP: 7 (ref 5–15)
BUN: 13 mg/dL (ref 6–20)
CALCIUM: 8.6 mg/dL — AB (ref 8.9–10.3)
CO2: 29 mmol/L (ref 22–32)
Chloride: 104 mmol/L (ref 101–111)
Creatinine, Ser: 0.76 mg/dL (ref 0.61–1.24)
Glucose, Bld: 122 mg/dL — ABNORMAL HIGH (ref 65–99)
Potassium: 3.2 mmol/L — ABNORMAL LOW (ref 3.5–5.1)
Sodium: 140 mmol/L (ref 135–145)

## 2015-09-23 LAB — MRSA PCR SCREENING: MRSA by PCR: NEGATIVE

## 2015-09-23 LAB — TSH: TSH: 0.734 u[IU]/mL (ref 0.350–4.500)

## 2015-09-23 SURGERY — PACEMAKER IMPLANT
Anesthesia: LOCAL

## 2015-09-23 MED ORDER — CEFAZOLIN SODIUM 1-5 GM-% IV SOLN
1.0000 g | Freq: Four times a day (QID) | INTRAVENOUS | Status: AC
  Administered 2015-09-23 – 2015-09-24 (×3): 1 g via INTRAVENOUS
  Filled 2015-09-23 (×3): qty 50

## 2015-09-23 MED ORDER — FENTANYL CITRATE (PF) 100 MCG/2ML IJ SOLN
INTRAMUSCULAR | Status: DC | PRN
  Administered 2015-09-23: 50 ug via INTRAVENOUS

## 2015-09-23 MED ORDER — ASPIRIN 81 MG PO CHEW
81.0000 mg | CHEWABLE_TABLET | Freq: Every day | ORAL | Status: DC
  Administered 2015-09-23 – 2015-09-24 (×2): 81 mg via ORAL
  Filled 2015-09-23 (×2): qty 1

## 2015-09-23 MED ORDER — SODIUM CHLORIDE 0.9 % IR SOLN: 80.0000 mg | Status: DC

## 2015-09-23 MED ORDER — LIDOCAINE HCL (PF) 1 % IJ SOLN
INTRAMUSCULAR | Status: DC | PRN
  Administered 2015-09-23: 30 mL via INTRADERMAL

## 2015-09-23 MED ORDER — SODIUM CHLORIDE 0.9 % IR SOLN
Status: DC | PRN
  Administered 2015-09-23: 17:00:00

## 2015-09-23 MED ORDER — LIDOCAINE HCL (PF) 1 % IJ SOLN
INTRAMUSCULAR | Status: AC
  Filled 2015-09-23: qty 60

## 2015-09-23 MED ORDER — SODIUM CHLORIDE 0.9 % IV SOLN
INTRAVENOUS | Status: DC
  Administered 2015-09-23: 12:00:00 via INTRAVENOUS

## 2015-09-23 MED ORDER — MIDAZOLAM HCL 5 MG/5ML IJ SOLN
INTRAMUSCULAR | Status: AC
  Filled 2015-09-23: qty 5

## 2015-09-23 MED ORDER — SODIUM CHLORIDE 0.9 % IR SOLN
Status: AC
  Filled 2015-09-23: qty 2

## 2015-09-23 MED ORDER — INFLUENZA VAC SPLIT QUAD 0.5 ML IM SUSY
0.5000 mL | PREFILLED_SYRINGE | INTRAMUSCULAR | Status: AC
  Administered 2015-09-24: 0.5 mL via INTRAMUSCULAR
  Filled 2015-09-23: qty 0.5

## 2015-09-23 MED ORDER — ENOXAPARIN SODIUM 40 MG/0.4ML ~~LOC~~ SOLN
40.0000 mg | SUBCUTANEOUS | Status: DC
  Filled 2015-09-23: qty 0.4

## 2015-09-23 MED ORDER — SODIUM CHLORIDE 0.9 % IJ SOLN
3.0000 mL | Freq: Two times a day (BID) | INTRAMUSCULAR | Status: DC
  Administered 2015-09-23 – 2015-09-24 (×3): 3 mL via INTRAVENOUS

## 2015-09-23 MED ORDER — POTASSIUM CHLORIDE CRYS ER 20 MEQ PO TBCR
40.0000 meq | EXTENDED_RELEASE_TABLET | ORAL | Status: AC
  Administered 2015-09-23 (×2): 40 meq via ORAL
  Filled 2015-09-23 (×2): qty 2

## 2015-09-23 MED ORDER — SODIUM CHLORIDE 0.9 % IV SOLN
INTRAVENOUS | Status: AC
Start: 2015-09-23 — End: 2015-09-23
  Administered 2015-09-23: 50 mL/h via INTRAVENOUS

## 2015-09-23 MED ORDER — CEFAZOLIN SODIUM-DEXTROSE 2-3 GM-% IV SOLR
2.0000 g | INTRAVENOUS | Status: AC
  Administered 2015-09-23: 2 g via INTRAVENOUS

## 2015-09-23 MED ORDER — MIDAZOLAM HCL 5 MG/5ML IJ SOLN
INTRAMUSCULAR | Status: DC | PRN
  Administered 2015-09-23: 3 mg via INTRAVENOUS

## 2015-09-23 MED ORDER — POTASSIUM CHLORIDE CRYS ER 20 MEQ PO TBCR
20.0000 meq | EXTENDED_RELEASE_TABLET | Freq: Three times a day (TID) | ORAL | Status: DC
  Administered 2015-09-23 – 2015-09-24 (×4): 20 meq via ORAL
  Filled 2015-09-23 (×4): qty 1

## 2015-09-23 MED ORDER — HEPARIN (PORCINE) IN NACL 2-0.9 UNIT/ML-% IJ SOLN
INTRAMUSCULAR | Status: AC
  Filled 2015-09-23: qty 500

## 2015-09-23 MED ORDER — ACETAMINOPHEN 325 MG PO TABS: 325.0000 mg | ORAL_TABLET | ORAL | Status: DC | PRN

## 2015-09-23 MED ORDER — PERFLUTREN LIPID MICROSPHERE
INTRAVENOUS | Status: AC
  Filled 2015-09-23: qty 10

## 2015-09-23 MED ORDER — CHLORHEXIDINE GLUCONATE 4 % EX LIQD
60.0000 mL | Freq: Once | CUTANEOUS | Status: AC
  Administered 2015-09-23: 4 via TOPICAL
  Filled 2015-09-23: qty 60

## 2015-09-23 MED ORDER — HEPARIN SODIUM (PORCINE) 1000 UNIT/ML IJ SOLN
INTRAMUSCULAR | Status: DC | PRN
  Administered 2015-09-23: 1000 [IU] via INTRAVENOUS

## 2015-09-23 MED ORDER — PERFLUTREN LIPID MICROSPHERE
1.0000 mL | INTRAVENOUS | Status: AC | PRN
  Filled 2015-09-23: qty 10

## 2015-09-23 MED ORDER — ATORVASTATIN CALCIUM 20 MG PO TABS: 20.0000 mg | ORAL_TABLET | Freq: Every evening | ORAL | Status: DC

## 2015-09-23 MED ORDER — LATANOPROST 0.005 % OP SOLN
1.0000 [drp] | Freq: Every day | OPHTHALMIC | Status: DC
  Administered 2015-09-23 (×2): 1 [drp] via OPHTHALMIC
  Filled 2015-09-23 (×2): qty 2.5

## 2015-09-23 MED ORDER — CEFAZOLIN SODIUM-DEXTROSE 2-3 GM-% IV SOLR
INTRAVENOUS | Status: AC
  Filled 2015-09-23: qty 50

## 2015-09-23 MED ORDER — FENTANYL CITRATE (PF) 100 MCG/2ML IJ SOLN
INTRAMUSCULAR | Status: AC
  Filled 2015-09-23: qty 2

## 2015-09-23 MED ORDER — CETYLPYRIDINIUM CHLORIDE 0.05 % MT LIQD
7.0000 mL | Freq: Two times a day (BID) | OROMUCOSAL | Status: DC
  Administered 2015-09-23 – 2015-09-24 (×2): 7 mL via OROMUCOSAL

## 2015-09-23 MED ORDER — ONDANSETRON HCL 4 MG/2ML IJ SOLN: 4.0000 mg | Freq: Four times a day (QID) | INTRAMUSCULAR | Status: DC | PRN

## 2015-09-23 SURGICAL SUPPLY — 9 items
CABLE SURGICAL S-101-97-12 (CABLE) ×2 IMPLANT
HEMOSTAT SURGICEL 2X4 FIBR (HEMOSTASIS) ×2 IMPLANT
LEAD CAPSURE NOVUS 5076-52CM (Lead) ×2 IMPLANT
LEAD CAPSURE NOVUS 5076-58CM (Lead) ×2 IMPLANT
PAD DEFIB LIFELINK (PAD) ×2 IMPLANT
PPM ADVISA MRI DR A2DR01 (Pacemaker) ×2 IMPLANT
SHEATH CLASSIC 7F (SHEATH) ×4 IMPLANT
TRAY PACEMAKER INSERTION (PACKS) ×2 IMPLANT
WIRE HI TORQ VERSACORE-J 145CM (WIRE) ×2 IMPLANT

## 2015-09-23 NOTE — Care Management Note (Signed)
Case Management Note  Patient Details  Name: SAMWISE WEHRI MRN: [id number] Date of Birth: 1941/08/24  Subjective/Objective:    Adm w complete heart block                Action/Plan: lives w fam, pcp dr Herbie [place] reade   Expected Discharge Date:                  Expected Discharge Plan:     In-House Referral:     Discharge planning Services     Post Acute Care Choice:    Choice offered to:     DME Arranged:    DME Agency:     HH Arranged:    [place] Agency:     Status of Service:     Medicare Important Message Given:    Date Medicare IM Given:    Medicare IM give by:    Date Additional Medicare IM Given:    Additional Medicare Important Message give by:     If discussed at [place] of Stay Meetings, dates discussed:    Additional Comments: ur review done  Lacretia Leigh, RN 09/23/2015, 8:07 AM

## 2015-09-23 NOTE — Consult Note (Addendum)
Marland Kitchen   ELECTROPHYSIOLOGY CONSULT NOTE    Patient ID: Alvin Simpson MRN: [id number], DOB/AGE: April 15, 1941 74 y.o.  Admit date: 09/22/2015 Date of Consult: 09/23/2015  Primary Physician: Vena Austria, MD Primary Cardiologist: Dr. Irish Lack  Reason for Consultation: CHB, syncope  HPI: Alvin Simpson is a 74 y.o. male trnasferred from [facility] for further evaluation and care after a syncopal episode noted to be in CHB.  He has PMHx of HTN, CAD and in Dec of 2015, a syncopal episode noting CHB that resolved off metoprolol, and with correction of hypokalemia (at that time 2.6), he required temp wire at that time, but no PPM after washout of the BB.  He is on Amlodipine outpatient currently for HTN managament.  The patient reports that he was at [facility] visiting a friend, had left and was in his truck talking with his wife and suddenly fainted, he reports his wife said he "blanked out".  He woke spontaneously without confusion and went back inside to the ER.  He did not have any CP, palpitations or preceding symptoms, no warning.  He reports 2 prior episodes, both late 2014, the second treated here for, as noted above, all 3 episodes happened the same, no warning, no particular symptoms before hand.  He denies any recent changes to his medicines, no illness or symptoms.    The patient received atropine at [place] with regain of SR  Past Medical History  Diagnosis Date  . Hypertension   . Cancer [facility]) prostate    with seeds  . Coronary artery disease   . Coronary atherosclerosis of native coronary artery 10/13/2013    CABG-[place]-1998     Surgical History:  Past Surgical History  Procedure Laterality Date  . Insertion prostate radiation seed    . Ankle surgery    . Coronary artery bypass graft      2  . Cardiac catheterization      18 years ago  . Eye surgery Right     cataract  . Colonoscopy    . Open reduction internal fixation (orif) distal radial fracture Left 07/06/2013   Procedure: OPEN REDUCTION INTERNAL FIXATION (ORIF) LEFT DISTAL RADIAL FRACTURE;  Surgeon: Roseanne Kaufman, MD;  Location: [place];  Service: Orthopedics;  Laterality: Left;  . Coronary artery bypass graft       Prescriptions prior to admission  Medication Sig Dispense Refill Last Dose  . amLODipine (NORVASC) 10 MG tablet Take 10 mg by mouth daily.   09/22/2015 at Unknown time  . aspirin 81 MG chewable tablet Chew 81 mg by mouth daily.   09/21/2015 at Unknown time  . atorvastatin (LIPITOR) 20 MG tablet Take 20 mg by mouth every evening.   09/21/2015 at Unknown time  . Coenzyme Q10 (COQ10 PO) Take 1 capsule by mouth every morning.    09/22/2015 at Unknown time  . doxazosin (CARDURA) 4 MG tablet Take 4 mg by mouth daily.    09/22/2015 at Unknown time  . hydrALAZINE (APRESOLINE) 10 MG tablet Take 1 tablet (10 mg total) by mouth 3 (three) times daily as needed (For systolic blood pressure (top number) greater than 150). 90 tablet 3 unknown  . hydrochlorothiazide (HYDRODIURIL) 25 MG tablet Take 12.5 mg by mouth 2 (two) times daily.    09/21/2015 at Unknown time  . ibuprofen (ADVIL,MOTRIN) 200 MG tablet Take 200 mg by mouth 2 (two) times daily as needed for headache or moderate pain.   09/21/2015 at Unknown time  . irbesartan (  AVAPRO) 300 MG tablet Take 300 mg by mouth daily.   09/22/2015 at Unknown time  . Multiple Vitamins-Minerals (ICAPS AREDS 2) CAPS Take 1 capsule by mouth 2 (two) times daily.   09/22/2015 at Unknown time  . potassium chloride SA (K-DUR,KLOR-CON) 20 MEQ tablet Take 20 mEq by mouth 3 (three) times daily.    09/22/2015 at Unknown time  . TRAVATAN Z 0.004 % SOLN ophthalmic solution Place 1 drop into both eyes every evening.  5 09/21/2015 at Unknown time    Inpatient Medications:  . antiseptic oral rinse  7 mL Mouth Rinse BID  . aspirin  81 mg Oral Daily  . atorvastatin  20 mg Oral QPM  .  ceFAZolin (ANCEF) IV  2 g Intravenous To Cath  . chlorhexidine  60 mL Topical Once  . gentamicin  irrigation  80 mg Irrigation To Cath  . [START ON 09/24/2015] Influenza vac split quadrivalent PF  0.5 mL Intramuscular Tomorrow-1000  . latanoprost  1 drop Both Eyes QHS  . potassium chloride SA  20 mEq Oral TID  . sodium chloride  3 mL Intravenous Q12H    Allergies: No Known Allergies  Social History   Social History  . Marital Status: Married    Spouse Name: N/A  . Number of Children: N/A  . Years of Education: N/A   Occupational History  . Not on file.   Social History Main Topics  . Smoking status: Former Smoker    Quit date: 07/05/1965  . Smokeless tobacco: Never Used  . Alcohol Use: Yes     Comment: occasional  . Drug Use: No  . Sexual Activity: Not on file   Other Topics Concern  . Not on file   Social History Narrative     Family History  Problem Relation Age of Onset  . Hypertension Mother   . Hypertension Father   . Heart disease Father   . Cirrhosis Father   . Hypertension Brother   . Hypertension Brother      Review of Systems: All other systems reviewed and are otherwise negative except as noted above.  Physical Exam: Filed Vitals:   09/23/15 0800 09/23/15 0900 09/23/15 1000 09/23/15 1100  BP: 129/74 103/61 131/74 140/78  Pulse: 59 56 49 57  Temp: 98.4 F (36.9 C)     TempSrc: Oral     Resp: 19 14 13 17   Height:      Weight:      SpO2: 95% 95% 98% 93%    GEN- The patient is well appearing, alert and oriented x 3 today.   HEENT: normocephalic, atraumatic; sclera clear, conjunctiva pink; hearing intact; oropharynx clear; neck supple Lungs- Clear to ausculation bilaterally, normal work of breathing.  No wheezes, rales, rhonchi Heart- Regular rate and rhythm, no murmurs, rubs or gallops GI- soft, non-tender, non-distended Extremities- no clubbing, cyanosis, or edema MS- no significant deformity or atrophy Skin- warm and dry, no rash or lesion Psych- euthymic mood, full affect Neuro- no gross deficits observed  Labs:   Lab Results    Component Value Date   WBC 10.2 09/23/2015   HGB 13.9 09/23/2015   HCT 40.8 09/23/2015   MCV 94.0 09/23/2015   PLT 215 09/23/2015    Recent Labs Lab 09/22/15 2125  09/23/15 0123  NA 141  < > 140  K 3.1*  < > 3.2*  CL 103  < > 104  CO2 29  --  29  BUN 17  < > 13  CREATININE 0.88  < > 0.76  CALCIUM 9.0  --  8.6*  PROT 7.2  --   --   BILITOT 0.7  --   --   ALKPHOS 68  --   --   ALT 22  --   --   AST 20  --   --   GLUCOSE 126*  < > 122*  < > = values in this interval not displayed.    Radiology/Studies: No results found.   07/15/13: Echocardiogram Study Conclusions - Left ventricle: The cavity size was normal. There was mild focal basal hypertrophy of the septum. Systolic function was normal. The estimated ejection fraction was in the range of 55% to 60%. Wall motion was normal; there were no regional wall motion abnormalities. - Aortic valve: Mild regurgitation. - Mitral valve: Calcified annulus. Mild to moderate regurgitation. - Left atrium: The atrium was moderately dilated. - Right ventricle: The cavity size was mildly dilated. Wall thickness was normal. - Right atrium: The atrium was mildly dilated. - Pulmonary arteries: Systolic pressure was mildly increased. PA peak pressure: 30mm Hg (S).  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 Syncope     CHB, conduction disease at baseline EKG     Hypokalemia (3.0 > 3.2) s/p K+ with ongoing relpacement     Hx of CHB on BB historically, hold amlodipine (last dose yesterday morning)     BP stable     Currently in SR     Check an echo May need PPM, pending further evaluation with Dr. Caryl Comes No driving  2. CAD     On ASA, statin     Prior cardiology note refers to a negative myoview in 2014 (note a partial report Sept 2014 fixed basal IW defect)  3. 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  The patient understands agrees and is willing to proceed.  await echo to assess LVEF   Would add aldactone for BP control given recurrent hypokalemia despite K repletion and the use of diuretics; they make hyperaldo unlikely

## 2015-09-23 NOTE — H&P (Addendum)
HPI: Alvin Simpson is a 74 yo man with h/o CAD s/p CABG, HTN, HLD, and syncope in the setting of bradycardia while on Toprol and hypokalemia who presented to [facility] ED after syncope.  Patient was visiting someone at [facility] and after leaving was sitting in truck with wife talking when he suddenly passed out.  He denies any preceding symptoms.  States he was talking and next thing he remembers he woke up wondering what had happen.  No confusion upon regaining consciousness.  Because of prior history, he looked at his fit bit and noted HR 30 or.  Upon arrival to [facility] ED, he was overall asymptomatic.  However, EKG showed SR with CHB and ventricular rate of 30 bpm.  Of note, his QRS morphology is rather similar to his baseline RBBB.  He was HD stable despite this bradycardia but began conducting 1:1 after receiving atropine.  His K was 3.1 and he has not been on any BB or AVN blocking agents for some time.     Review of Systems:     Cardiac Review of Systems: {Y] = yes [ ]  = no  Chest Pain [    ]  Resting SOB [   ] Exertional SOB  [  ]  Orthopnea [  ]   Pedal Edema [   ]    Palpitations [  ] Syncope  [  ]   Presyncope [   ]  General Review of Systems: [Y] = yes [  ]=no Constitional: recent weight change [  ]; anorexia [  ]; fatigue [  ]; nausea [  ]; night sweats [  ]; fever [  ]; or chills [  ];                                                                      Dental: poor dentition[  ];   Eye : blurred vision [  ]; diplopia [   ]; vision changes [  ];  Amaurosis fugax[  ]; Resp: cough [  ];  wheezing[  ];  hemoptysis[  ]; shortness of breath[  ]; paroxysmal nocturnal dyspnea[  ]; dyspnea on exertion[  ]; or orthopnea[  ];  GI:  gallstones[  ], vomiting[  ];  dysphagia[  ]; melena[  ];  hematochezia [  ]; heartburn[  ];   GU: kidney stones [  ]; hematuria[  ];   dysuria [  ];  nocturia[  ];               Skin: rash [  ], swelling[  ];, hair loss[  ];  peripheral edema[  ];  or itching[   ]; Musculosketetal: myalgias[  ];  joint swelling[  ];  joint erythema[  ];  joint pain[  ];  back pain[  ];  Heme/Lymph: bruising[  ];  bleeding[  ];  anemia[  ];  Neuro: TIA[  ];  headaches[  ];  stroke[  ];  vertigo[  ];  seizures[  ];   paresthesias[  ];  difficulty walking[  ];  Psych:depression[  ]; anxiety[  ];  Endocrine: diabetes[  ];  thyroid dysfunction[  ];  Other:  Past Medical History  Diagnosis Date  .  Hypertension   . 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 Coronary artery disease   . Coronary atherosclerosis of native coronary artery 10/13/2013    CABG-[place]-1998    No current facility-administered medications on file prior to encounter.   Current Outpatient Prescriptions on File Prior to Encounter  Medication Sig Dispense Refill  . amLODipine (NORVASC) 10 MG tablet Take 10 mg by mouth daily.    Marland Kitchen aspirin 81 MG chewable tablet Chew 81 mg by mouth daily.    Marland Kitchen atorvastatin (LIPITOR) 20 MG tablet Take 20 mg by mouth every evening.    . Coenzyme Q10 (COQ10 PO) Take 1 capsule by mouth every morning.     Marland Kitchen doxazosin (CARDURA) 4 MG tablet Take 4 mg by mouth daily.     . hydrALAZINE (APRESOLINE) 10 MG tablet Take 1 tablet (10 mg total) by mouth 3 (three) times daily as needed (For systolic blood pressure (top number) greater than 150). 90 tablet 3  . hydrochlorothiazide (HYDRODIURIL) 25 MG tablet Take 12.5 mg by mouth 2 (two) times daily.     . irbesartan (AVAPRO) 300 MG tablet Take 300 mg by mouth daily.    . potassium chloride SA (K-DUR,KLOR-CON) 20 MEQ tablet Take 20 mEq by mouth 3 (three) times daily.        No Known Allergies  Social History   Social History  . Marital Status: Married    Spouse Name: N/A  . Number of Children: N/A  . Years of Education: N/A   Occupational History  . Not on file.   Social History Main Topics  . Smoking status: Former Smoker    Quit date: 07/05/1965  . Smokeless tobacco: Never Used  . Alcohol Use: Yes     Comment:  occasional  . Drug Use: No  . Sexual Activity: Not on file   Other Topics Concern  . Not on file   Social History Narrative    History reviewed. No pertinent family history.  PHYSICAL EXAM: Filed Vitals:   09/22/15 2249 09/22/15 2331  BP: 112/52 138/77  Pulse: 27 78  Temp:  97.9 F (36.6 C)  Resp: 14 17   General:  Well appearing. No respiratory difficulty HEENT: normal Neck: supple. no JVD. Carotids 2+ bilat; no bruits. No lymphadenopathy or thryomegaly appreciated. Cor: PMI nondisplaced. Regular rate & rhythm. No rubs, gallops or murmurs. Well healed remote sternotomy scar. Lungs: clear Abdomen: soft, nontender, nondistended. No hepatosplenomegaly. No bruits or masses. Good bowel sounds. Extremities: no cyanosis, clubbing, rash, edema Neuro: alert & oriented x 3, cranial nerves grossly intact. moves all 4 extremities w/o difficulty. Affect pleasant.  ECG:  SR with CHB.   QRS morphology resembles baseline RBBB suggesting possible slow/low junctional escape rhythm.   Results for orders placed or performed during the hospital encounter of 09/22/15 (from the past 24 hour(s))  CBC with Differential/Platelet     Status: Abnormal   Collection Time: 09/22/15  9:25 PM  Result Value Ref Range   WBC 10.6 (H) 4.0 - 10.5 K/uL   RBC 4.41 4.22 - 5.81 MIL/uL   Hemoglobin 14.0 13.0 - 17.0 g/dL   HCT 41.5 39.0 - 52.0 %   MCV 94.1 78.0 - 100.0 fL   MCH 31.7 26.0 - 34.0 pg   MCHC 33.7 30.0 - 36.0 g/dL   RDW 13.1 11.5 - 15.5 %   Platelets 246 150 - 400 K/uL   Neutrophils Relative % 55 %   Neutro Abs 5.8  1.7 - 7.7 K/uL   Lymphocytes Relative 35 %   Lymphs Abs 3.7 0.7 - 4.0 K/uL   Monocytes Relative 8 %   Monocytes Absolute 0.9 0.1 - 1.0 K/uL   Eosinophils Relative 2 %   Eosinophils Absolute 0.2 0.0 - 0.7 K/uL   Basophils Relative 0 %   Basophils Absolute 0.0 0.0 - 0.1 K/uL  Comprehensive metabolic panel     Status: Abnormal   Collection Time: 09/22/15  9:25 PM  Result Value Ref  Range   Sodium 141 135 - 145 mmol/L   Potassium 3.1 (L) 3.5 - 5.1 mmol/L   Chloride 103 101 - 111 mmol/L   CO2 29 22 - 32 mmol/L   Glucose, Bld 126 (H) 65 - 99 mg/dL   BUN 17 6 - 20 mg/dL   Creatinine, Ser 0.88 0.61 - 1.24 mg/dL   Calcium 9.0 8.9 - 10.3 mg/dL   Total Protein 7.2 6.5 - 8.1 g/dL   Albumin 4.3 3.5 - 5.0 g/dL   AST 20 15 - 41 U/L   ALT 22 17 - 63 U/L   Alkaline Phosphatase 68 38 - 126 U/L   Total Bilirubin 0.7 0.3 - 1.2 mg/dL   GFR calc non Af Amer >60 >60 mL/min   GFR calc Af Amer >60 >60 mL/min   Anion gap 9 5 - 15  Magnesium     Status: None   Collection Time: 09/22/15  9:25 PM  Result Value Ref Range   Magnesium 2.0 1.7 - 2.4 mg/dL  Troponin I     Status: None   Collection Time: 09/22/15  9:25 PM  Result Value Ref Range   Troponin I <0.03 <0.031 ng/mL  Brain natriuretic peptide     Status: None   Collection Time: 09/22/15  9:25 PM  Result Value Ref Range   B Natriuretic Peptide 35.5 0.0 - 100.0 pg/mL  I-Stat Chem 8, ED     Status: Abnormal   Collection Time: 09/22/15  9:31 PM  Result Value Ref Range   Sodium 142 135 - 145 mmol/L   Potassium 3.0 (L) 3.5 - 5.1 mmol/L   Chloride 100 (L) 101 - 111 mmol/L   BUN 16 6 - 20 mg/dL   Creatinine, Ser 0.90 0.61 - 1.24 mg/dL   Glucose, Bld 122 (H) 65 - 99 mg/dL   Calcium, Ion 1.07 (L) 1.13 - 1.30 mmol/L   TCO2 28 0 - 100 mmol/L   Hemoglobin 14.3 13.0 - 17.0 g/dL   HCT 42.0 39.0 - 52.0 %  I-Stat CG4 Lactic Acid, ED     Status: None   Collection Time: 09/22/15  9:59 PM  Result Value Ref Range   Lactic Acid, Venous 1.59 0.5 - 2.0 mmol/L   No results found.   ASSESSMENT: 74 yo man with syncope in setting of CHB.  He currently is not on any AVN blocking agents and no significant metabolic disturbances aside from mild hypokalemia.  PLAN/DISCUSSION: Admit to ccu Replete K Check TSH Atropine prn as is improved conduction earlier Echo in AM to assess LVF, last 2 years ago EP to see for consideration of PPM  implantation, NPO in case Hold anti-hypertensives in case of CHB/bradycardia recurrence Continue ASA, statin for CAD

## 2015-09-23 NOTE — Progress Notes (Signed)
  Echocardiogram 2D Echocardiogram with Definity has been performed.  Phuong Moffatt 09/23/2015, 1:15 PM

## 2015-09-24 ENCOUNTER — Other Ambulatory Visit: Payer: Self-pay | Admitting: Physician Assistant

## 2015-09-24 ENCOUNTER — Encounter (HOSPITAL_COMMUNITY): Payer: Self-pay | Admitting: Physician Assistant

## 2015-09-24 ENCOUNTER — Inpatient Hospital Stay (HOSPITAL_COMMUNITY): Payer: Medicare Other

## 2015-09-24 DIAGNOSIS — E876 Hypokalemia: Secondary | ICD-10-CM

## 2015-09-24 DIAGNOSIS — I442 Atrioventricular block, complete: Secondary | ICD-10-CM | POA: Diagnosis not present

## 2015-09-24 LAB — BASIC METABOLIC PANEL
Anion gap: 5 (ref 5–15)
BUN: 10 mg/dL (ref 6–20)
CALCIUM: 8.8 mg/dL — AB (ref 8.9–10.3)
CHLORIDE: 104 mmol/L (ref 101–111)
CO2: 30 mmol/L (ref 22–32)
CREATININE: 0.78 mg/dL (ref 0.61–1.24)
GFR calc non Af Amer: >60 mL/min (ref >60)
GLUCOSE: 108 mg/dL — AB (ref 65–99)
Potassium: 3.4 mmol/L — ABNORMAL LOW (ref 3.5–5.1)
Sodium: 139 mmol/L (ref 135–145)

## 2015-09-24 MED ORDER — SPIRONOLACTONE 25 MG PO TABS: 25.0000 mg | ORAL_TABLET | Freq: Every day | ORAL | Status: DC

## 2015-09-24 MED ORDER — POTASSIUM CHLORIDE CRYS ER 20 MEQ PO TBCR
20.0000 meq | EXTENDED_RELEASE_TABLET | Freq: Once | ORAL | Status: AC
  Administered 2015-09-24: 20 meq via ORAL
  Filled 2015-09-24: qty 1

## 2015-09-24 NOTE — Care Management (Signed)
1129 09-24-15  Jacqlyn Krauss, RN,BSN [phone] Pt admitted for symptomatic bradycardia- s/p pacemaker implantation-Plan for d/c home.  No needs identified by CM at this time.

## 2015-09-24 NOTE — Plan of Care (Signed)
Problem: Safety: Goal: Ability to remain free from injury will improve Outcome: Completed/Met Date Met:  09/24/15 Pt educated on safety measures put into place. Pt verbalized understanding.

## 2015-09-24 NOTE — Discharge Instructions (Signed)
° ° °  Supplemental Discharge Instructions for  Pacemaker/Defibrillator Patients  Activity No heavy lifting or vigorous activity with your left/right arm for 6 to 8 weeks.  Do not raise your left/right arm above your head for one week.  Gradually raise your affected arm as drawn below.             09/28/15                   09/29/15                   09/30/15                 10/01/15 __  NO DRIVING for 20mo    ; you may begin driving on  Jan [id number]   .  WOUND CARE - Keep the wound area clean and dry.  Do not get this area wet for one week. No showers for one week; you may shower on 10/01/15    . - The tape/steri-strips on your wound will fall off; do not pull them off.  No bandage is needed on the site.  DO  NOT apply any creams, oils, or ointments to the wound area. - If you notice any drainage or discharge from the wound, any swelling or bruising at the site, or you develop a fever > 101? F after you are discharged home, call the office at once.  Special Instructions - You are still able to use cellular telephones; use the ear opposite the side where you have your pacemaker/defibrillator.  Avoid carrying your cellular phone near your device. - When traveling through airports, show security personnel your identification card to avoid being screened in the metal detectors.  Ask the security personnel to use the hand wand. - Avoid arc welding equipment, MRI testing (magnetic resonance imaging), TENS units (transcutaneous nerve stimulators).  Call the office for questions about other devices. - Avoid electrical appliances that are in poor condition or are not properly grounded. - Microwave ovens are safe to be near or to operate.  Additional information for defibrillator patients should your device go off: - If your device goes off ONCE and you feel fine afterward, notify the device clinic nurses. - If your device goes off ONCE and you do not feel well afterward, call 911. - If your device  goes off TWICE, call 911. - If your device goes off THREE times in one day, call 911.  DO NOT DRIVE YOURSELF OR A FAMILY MEMBER WITH A DEFIBRILLATOR TO THE HOSPITAL--CALL 911.

## 2015-09-24 NOTE — Plan of Care (Signed)
Problem: Education: Goal: Knowledge of [facility] Education information/materials will improve Outcome: Completed/Met Date Met:  09/24/15 Pt educated on medications, discharge needs, and plan of care. Pt verbalized understanding.

## 2015-09-24 NOTE — Plan of Care (Signed)
Problem: Pain Managment: Goal: General experience of comfort will improve Outcome: Completed/Met Date Met:  09/24/15 Pt educated on pain scale and interventions. Pt has no complaints of pain at this time. Pt verbalized understanding.

## 2015-09-24 NOTE — Discharge Summary (Addendum)
ELECTROPHYSIOLOGY PROCEDURE DISCHARGE SUMMARY    Patient ID: Alvin Simpson,  MRN: [id number], DOB/AGE: [id number] 74 y.o.  Admit date: 09/22/2015 Discharge date: 09/24/2015  Primary Care Physician: Vena Austria, MD Primary Cardiologist: Dr. Irish Lack Electrophysiologist: Dr. Caryl Comes  Primary Discharge Diagnosis:  Symptomatic bradycardia status post pacemaker implantation this admission  Secondary Discharge Diagnosis:  1. HTN 2. CAD  No Known Allergies   Procedures This Admission:  1.  Implantation of a MDT dual chamber PPM on 09/23/15 by Dr Caryl Comes.  The patient received a  Medtronic MRI compatible ventricular lead serial [id number] and a Medtronic MRI compatible 5076 atrial lead serial number [id number]. There were no immediate post procedure complications. 2.  CXR on 12/8 demonstrated no pneumothorax status post device implantation.   Brief HPI: Alvin Simpson is a 74 y.o. male was transferred from [facility] ER to [facility] after a syncopal event noting him to be in CHB there here received atropine then maintaining SR.  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 This event however not on AV nodal blocking agents.    Past medical history includes CAD and HTN.  The patient has had symptomatic bradycardia without reversible causes identified.  Risks, benefits, and alternatives to PPM implantation were reviewed with the patient who wished to proceed.   Hospital Course:  The patient was admitted and underwent implantation of a PPM yesterday with details as outlined above.  He was monitored on telemetry overnight which demonstrated ventricular pacing.  Left chest was without hematoma or ecchymosis.  The device was interrogated and found to be functioning normally.  CXR was obtained and demonstrated no pneumothorax status post device implantation.  The patient had an echo with LVEF 60-65%, no significicant VHD.  Wound care, arm  mobility, and restrictions were reviewed with the patient.  The patient was examined by Dr. Lovena Le and considered stable for discharge to home. The patient's K+ was further replaced, his home medications adjusted to try and avoid future hypokalemia issues, we will get a BMET at his wound check appointment.  The patient has been advised [place] law, no driving for a month.   Physical Exam: Filed Vitals:   09/23/15 1825 09/23/15 1852 09/23/15 2103 09/24/15 0500  BP: 138/77 146/89 152/90 150/86  Pulse: 62 60 70 72  Temp:  98 F (36.7 C) 98.2 F (36.8 C) 98.3 F (36.8 C)  TempSrc:  Oral    Resp: 19 12 19 17   Height:      Weight:  189 lb 12.8 oz (86.093 kg)  189 lb (85.73 kg)  SpO2: 90% 92% 91% 98%   GEN- The patient is well appearing, alert and oriented x 3 today.   HEENT: normocephalic, atraumatic; sclera clear, conjunctiva pink; hearing intact; neck supple, no JVP Lungs- Clear to ausculation bilaterally, normal work of breathing.  No wheezes, rales, rhonchi Heart- Regular rate and rhythm, no murmurs, rubs or gallops, PMI not laterally displaced GI- soft, non-tender, non-distended Extremities- no clubbing, cyanosis, or edema MS- no significant deformity or atrophy Skin- warm and dry, no rash or lesion, left chest without hematoma/ecchymosis Psych- euthymic mood, full affect Neuro- no gross deficits   Labs:   Lab Results  Component Value Date   WBC 10.2 09/23/2015   HGB 13.9 09/23/2015   HCT 40.8 09/23/2015   MCV 94.0 09/23/2015   PLT 215 09/23/2015    Recent Labs Lab 09/22/15 2125  09/24/15 1018  NA  141  < > 139  K 3.1*  < > 3.4*  CL 103  < > 104  CO2 29  < > 30  BUN 17  < > 10  CREATININE 0.88  < > 0.78  CALCIUM 9.0  < > 8.8*  PROT 7.2  --   --   BILITOT 0.7  --   --   ALKPHOS 68  --   --   ALT 22  --   --   AST 20  --   --   GLUCOSE 126*  < > 108*  < > = values in this interval not displayed.  09/24/15: CXR Satisfactory DDD PM lead placement with no  PTX  Discharge Medications:    Medication List    STOP taking these medications        hydrochlorothiazide 25 MG tablet  Commonly known as:  HYDRODIURIL     potassium chloride SA 20 MEQ tablet  Commonly known as:  K-DUR,KLOR-CON      TAKE these medications        amLODipine 10 MG tablet  Commonly known as:  NORVASC  Take 10 mg by mouth daily.     aspirin 81 MG chewable tablet  Chew 81 mg by mouth daily.     atorvastatin 20 MG tablet  Commonly known as:  LIPITOR  Take 20 mg by mouth every evening.     COQ10 PO  Take 1 capsule by mouth every morning.     doxazosin 4 MG tablet  Commonly known as:  CARDURA  Take 4 mg by mouth daily.     hydrALAZINE 10 MG tablet  Commonly known as:  APRESOLINE  Take 1 tablet (10 mg total) by mouth 3 (three) times daily as needed (For systolic blood pressure (top number) greater than 150).     ibuprofen 200 MG tablet  Commonly known as:  ADVIL,MOTRIN  Take 200 mg by mouth 2 (two) times daily as needed for headache or moderate pain.     ICAPS AREDS 2 Caps  Take 1 capsule by mouth 2 (two) times daily.     irbesartan 300 MG tablet  Commonly known as:  AVAPRO  Take 300 mg by mouth daily.     spironolactone 25 MG tablet  Commonly known as:  ALDACTONE  Take 1 tablet (25 mg total) by mouth daily.     TRAVATAN Z 0.004 % Soln ophthalmic solution  Generic drug:  Travoprost (BAK Free)  Place 1 drop into both eyes every evening.        Disposition: Home Discharge Instructions    Diet - low sodium heart healthy    Complete by:  As directed      Increase activity slowly    Complete by:  As directed           Follow-up Information    Follow up with [facility] On 10/07/2015.   Specialty:  Cardiology   Why:  wound check and lab draw   Contact information:   [address] [place] [phone]      Follow up with Virl Axe, MD On 12/24/2015.   Specialty:  Cardiology    Why:  10:15AM   Contact information:   [address] [address] [phone]       Duration of Discharge Encounter: Greater than 30 minutes including physician time.  Venetia Night, PA-C 09/24/2015 11:46 AM  EP Attending  Patient seen and examined. Agree with the findings as noted above with minimal modification. The patient is stable after there PPM placed for intermittent CHB. [place] for discharge with usual followup.  Mikle Bosworth.D.

## 2015-09-25 MED FILL — Heparin Sodium (Porcine) 2 Unit/ML in Sodium Chloride 0.9%: INTRAMUSCULAR | Qty: 500 | Status: AC

## 2015-10-01 ENCOUNTER — Telehealth: Payer: Self-pay | Admitting: Internal Medicine

## 2015-10-01 NOTE — Telephone Encounter (Signed)
Spoke to patient about the ecchymosis near his armpit. I explained to him that this is not an abnormal finding after a procedure and that it will become lighter, turn green/yellow, and then eventually disappear. I asked him if he noticed any swelling of the device pocket. Patient stated that he has notice some and thinks that it may have gotten bigger since discharge. Patient to f/u w/ the device clinic on 12/16 for wound check only. Patient voiced understanding.

## 2015-10-01 NOTE — Telephone Encounter (Signed)
New Message  Pt c/o black/blue bruising on side of wound near armpit. Please call back and discuss.

## 2015-10-02 ENCOUNTER — Encounter: Payer: Self-pay | Admitting: Internal Medicine

## 2015-10-02 ENCOUNTER — Ambulatory Visit (INDEPENDENT_AMBULATORY_CARE_PROVIDER_SITE_OTHER): Payer: Medicare Other | Admitting: *Deleted

## 2015-10-02 DIAGNOSIS — I442 Atrioventricular block, complete: Secondary | ICD-10-CM | POA: Diagnosis not present

## 2015-10-05 LAB — CUP PACEART INCLINIC DEVICE CHECK
Brady Statistic AP VP Percent: 0.02 %
Brady Statistic AP VS Percent: 15.98 %
Brady Statistic AS VP Percent: 0.05 %
Brady Statistic RA Percent Paced: 16 %
Brady Statistic RV Percent Paced: 0.06 %
Implantable Lead Implant Date: 20161207
Implantable Lead Location: 753859
Implantable Lead Location: 753860
Implantable Lead Model: 5076
Implantable Lead Model: 5076
Lead Channel Impedance Value: 361 Ohm
Lead Channel Impedance Value: 437 Ohm
Lead Channel Impedance Value: 437 Ohm
Lead Channel Pacing Threshold Amplitude: 0.625 V
Lead Channel Pacing Threshold Pulse Width: 0.4 ms
Lead Channel Sensing Intrinsic Amplitude: 22 mV
Lead Channel Sensing Intrinsic Amplitude: 3 mV
Lead Channel Setting Pacing Pulse Width: 0.4 ms
MDC IDC LEAD IMPLANT DT: 20161207
MDC IDC MSMT BATTERY VOLTAGE: 3.13 V
MDC IDC MSMT LEADCHNL RA SENSING INTR AMPL: 3.625 mV
MDC IDC MSMT LEADCHNL RV IMPEDANCE VALUE: 532 Ohm
MDC IDC MSMT LEADCHNL RV PACING THRESHOLD AMPLITUDE: 0.5 V
MDC IDC MSMT LEADCHNL RV PACING THRESHOLD PULSEWIDTH: 0.4 ms
MDC IDC MSMT LEADCHNL RV SENSING INTR AMPL: 15.5 mV
MDC IDC SESS DTM: 20161216153628
MDC IDC SET LEADCHNL RA PACING AMPLITUDE: 3.5 V
MDC IDC SET LEADCHNL RV PACING AMPLITUDE: 3.5 V
MDC IDC SET LEADCHNL RV SENSING SENSITIVITY: 2.8 mV
MDC IDC STAT BRADY AS VS PERCENT: 83.95 %

## 2015-10-05 NOTE — Progress Notes (Signed)
Wound check appointment. Steri-strips removed. Wound without redness. Incision edges approximated, wound well healed. Small hematoma noted---ecchymosis has begun to turn yellow---patient encouraged to call if site gets bigger. Normal device function. Thresholds, sensing, and impedances consistent with implant measurements. Device programmed at 3.5V for extra safety margin until 3 month visit. Histogram distribution appropriate for patient and level of activity. No mode switches or high ventricular rates noted. Patient educated about wound care, arm mobility, lifting restrictions. ROV in 3 months with SK.

## 2015-10-07 ENCOUNTER — Ambulatory Visit: Payer: Medicare Other

## 2015-10-09 ENCOUNTER — Other Ambulatory Visit (INDEPENDENT_AMBULATORY_CARE_PROVIDER_SITE_OTHER): Payer: Medicare Other | Admitting: *Deleted

## 2015-10-09 DIAGNOSIS — E876 Hypokalemia: Secondary | ICD-10-CM | POA: Diagnosis not present

## 2015-10-09 LAB — BASIC METABOLIC PANEL
BUN: 16 mg/dL (ref 7–25)
CHLORIDE: 103 mmol/L (ref 98–110)
CO2: 27 mmol/L (ref 20–31)
CREATININE: 0.8 mg/dL (ref 0.70–1.18)
Calcium: 9.4 mg/dL (ref 8.6–10.3)
Glucose, Bld: 116 mg/dL — ABNORMAL HIGH (ref 65–99)
POTASSIUM: 4.2 mmol/L (ref 3.5–5.3)
SODIUM: 141 mmol/L (ref 135–146)

## 2015-10-13 ENCOUNTER — Telehealth: Payer: Self-pay | Admitting: *Deleted

## 2015-10-13 NOTE — Telephone Encounter (Signed)
-----   Message from [facility], [place] sent at 10/09/2015  3:51 PM EST ----- Please let the patient know his lab looks good.  Potassium in wnl, no changes to be made at this time.  To f/u at his planned date.  Thanks Tommye Standard, PA-C

## 2015-10-23 ENCOUNTER — Other Ambulatory Visit: Payer: Self-pay | Admitting: *Deleted

## 2015-10-23 MED ORDER — SPIRONOLACTONE 25 MG PO TABS: 25.0000 mg | ORAL_TABLET | Freq: Every day | ORAL | Status: AC

## 2015-12-23 ENCOUNTER — Encounter: Payer: Self-pay | Admitting: Internal Medicine

## 2015-12-23 ENCOUNTER — Ambulatory Visit (INDEPENDENT_AMBULATORY_CARE_PROVIDER_SITE_OTHER): Payer: Medicare Other | Admitting: Internal Medicine

## 2015-12-23 VITALS — BP 120/72 | HR 75 | Ht 69.0 in | Wt 188.2 lb

## 2015-12-23 DIAGNOSIS — Z950 Presence of cardiac pacemaker: Secondary | ICD-10-CM | POA: Diagnosis not present

## 2015-12-23 DIAGNOSIS — I495 Sick sinus syndrome: Secondary | ICD-10-CM

## 2015-12-23 DIAGNOSIS — I442 Atrioventricular block, complete: Secondary | ICD-10-CM

## 2015-12-23 NOTE — Patient Instructions (Signed)
Medication Instructions: - Your physician recommends that you continue on your current medications as directed. Please refer to the Current Medication list given to you today.  Labwork: - none  Procedures/Testing: - none  Follow-Up: - Remote monitoring is used to monitor your Pacemaker of ICD from home. This monitoring reduces the number of office visits required to check your device to one time per year. It allows [place] to keep an eye on the functioning of your device to ensure it is working properly. You are scheduled for a device check from home on 03/23/16. You may send your transmission at any time that day. If you have a wireless device, the transmission will be sent automatically. After your physician reviews your transmission, you will receive a postcard with your next transmission date.  - Your physician wants you to follow-up in: 9 months with Dr. Caryl Comes. You will receive a reminder letter in the mail two months in advance. If you don't receive a letter, please call our office to schedule the follow-up appointment.  Any Additional Special Instructions Will Be Listed Below (If Applicable).     If you need a refill on your cardiac medications before your next appointment, please call your pharmacy.

## 2015-12-23 NOTE — Progress Notes (Signed)
Patient Care Team: Maury Dus, MD as PCP - General (Family Medicine)   HPI  Alvin Simpson is a 75 y.o. male Seen in follow-up for pacemaker implanted 12/16. He was initially seen in 2014 at which time he had intermittent complete heart block which resolved with changes in his medications. He had significant wheezing and I didn't underlying trifascicular block.  His history of coronary artery disease with previously normal LV function and prior bypass surgery  Records and Results Reviewed  Hospital records including echocardiogram demonstrating EF 60-65% without valvular heart disease.   Blood pressure is been much better controlled with the intercurrent addition of spironolactone. This also helped with recurrent hypokalemia. His potassium level is most recently 4.2. Blood work is scheduled with his PCP next week. Past Medical History  Diagnosis Date  . Hypertension   . 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 Coronary artery disease   . Coronary atherosclerosis of native coronary artery 10/13/2013    CABG-[place]-1998  . Complete heart block ([place]) 09/23/15    MDT PPM, Dr. Caryl Comes    Past Surgical History  Procedure Laterality Date  . Insertion prostate radiation seed    . Ankle surgery    . Coronary artery bypass graft      2  . Cardiac catheterization      18 years ago  . Eye surgery Right     cataract  . Colonoscopy    . Open reduction internal fixation (orif) distal radial fracture Left 07/06/2013    Procedure: OPEN REDUCTION INTERNAL FIXATION (ORIF) LEFT DISTAL RADIAL FRACTURE;  Surgeon: Roseanne Kaufman, MD;  Location: [place];  Service: Orthopedics;  Laterality: Left;  . Coronary artery bypass graft    . Ep implantable device N/A 09/23/2015    Procedure: Pacemaker Implant;  Surgeon: Deboraha Sprang, MD;  Location: [place] [facility];  Service: Cardiovascular;  Laterality: N/A;    Current Outpatient Prescriptions  Medication Sig Dispense Refill  . amLODipine  (NORVASC) 10 MG tablet Take 10 mg by mouth daily.    Marland Kitchen aspirin 81 MG chewable tablet Chew 81 mg by mouth daily.    Marland Kitchen atorvastatin (LIPITOR) 20 MG tablet Take 20 mg by mouth every evening.    . Coenzyme Q10 (COQ10 PO) Take 1 capsule by mouth every morning.     Marland Kitchen doxazosin (CARDURA) 4 MG tablet Take 4 mg by mouth daily.     . hydrALAZINE (APRESOLINE) 10 MG tablet Take 1 tablet (10 mg total) by mouth 3 (three) times daily as needed (For systolic blood pressure (top number) greater than 150). 90 tablet 3  . ibuprofen (ADVIL,MOTRIN) 200 MG tablet Take 200 mg by mouth 2 (two) times daily as needed for headache or moderate pain.    Marland Kitchen irbesartan (AVAPRO) 300 MG tablet Take 300 mg by mouth daily.    . Multiple Vitamins-Minerals (ICAPS AREDS 2) CAPS Take 1 capsule by mouth 2 (two) times daily.    Marland Kitchen spironolactone (ALDACTONE) 25 MG tablet Take 1 tablet (25 mg total) by mouth daily. 90 tablet 1  . TRAVATAN Z 0.004 % SOLN ophthalmic solution Place 1 drop into both eyes every evening.  5   No current facility-administered medications for this visit.    No Known Allergies    Review of Systems negative except from HPI and PMH  Physical Exam BP 120/72 mmHg  Pulse 75  Ht 5\' 9"  (1.753 m)  Wt 188 lb 3.2  oz (85.367 kg)  BMI 27.78 kg/m2 Well developed and well nourished in no acute distress HENT normal E scleral and icterus clear Neck Supple JVP flat; carotids brisk and full Clear to ausculation Device pocket well healed; without hematoma or erythema.  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  The device was reprogrammed to  maxuimize longevity  BP well controlled   Hypokalemia better on aldactone

## 2016-03-23 ENCOUNTER — Ambulatory Visit (INDEPENDENT_AMBULATORY_CARE_PROVIDER_SITE_OTHER): Payer: Medicare Other | Admitting: *Deleted

## 2016-03-23 ENCOUNTER — Telehealth: Payer: Self-pay | Admitting: Cardiology

## 2016-03-23 DIAGNOSIS — I495 Sick sinus syndrome: Secondary | ICD-10-CM | POA: Diagnosis not present

## 2016-03-23 DIAGNOSIS — I442 Atrioventricular block, complete: Secondary | ICD-10-CM

## 2016-03-23 DIAGNOSIS — Z950 Presence of cardiac pacemaker: Secondary | ICD-10-CM

## 2016-03-23 NOTE — Telephone Encounter (Signed)
Attempted to confirm remote transmission with pt. No answer and was unable to leave a message.   

## 2016-03-24 NOTE — Progress Notes (Signed)
Remote pacemaker transmission.   

## 2016-03-31 LAB — CUP PACEART REMOTE DEVICE CHECK
Brady Statistic AS VP Percent: 7.3 %
Brady Statistic AS VS Percent: 70.86 %
Date Time Interrogation Session: 20170607231149
Implantable Lead Implant Date: 20161207
Implantable Lead Location: 753859
Lead Channel Impedance Value: 342 Ohm
Lead Channel Impedance Value: 437 Ohm
Lead Channel Impedance Value: 494 Ohm
Lead Channel Pacing Threshold Amplitude: 0.5 V
Lead Channel Pacing Threshold Pulse Width: 0.4 ms
Lead Channel Sensing Intrinsic Amplitude: 17.125 mV
Lead Channel Setting Pacing Amplitude: 2 V
Lead Channel Setting Pacing Pulse Width: 0.4 ms
MDC IDC LEAD IMPLANT DT: 20161207
MDC IDC LEAD LOCATION: 753860
MDC IDC MSMT BATTERY REMAINING LONGEVITY: 124 mo
MDC IDC MSMT BATTERY VOLTAGE: 3.04 V
MDC IDC MSMT LEADCHNL RA PACING THRESHOLD AMPLITUDE: 0.75 V
MDC IDC MSMT LEADCHNL RA PACING THRESHOLD PULSEWIDTH: 0.4 ms
MDC IDC MSMT LEADCHNL RA SENSING INTR AMPL: 4.125 mV
MDC IDC MSMT LEADCHNL RA SENSING INTR AMPL: 4.125 mV
MDC IDC MSMT LEADCHNL RV IMPEDANCE VALUE: 551 Ohm
MDC IDC MSMT LEADCHNL RV SENSING INTR AMPL: 17.125 mV
MDC IDC SET LEADCHNL RV PACING AMPLITUDE: 2.5 V
MDC IDC SET LEADCHNL RV SENSING SENSITIVITY: 2.8 mV
MDC IDC STAT BRADY AP VP PERCENT: 0.19 %
MDC IDC STAT BRADY AP VS PERCENT: 21.65 %
MDC IDC STAT BRADY RA PERCENT PACED: 21.83 %
MDC IDC STAT BRADY RV PERCENT PACED: 7.49 %

## 2016-04-06 ENCOUNTER — Encounter: Payer: Self-pay | Admitting: Cardiology

## 2016-06-24 ENCOUNTER — Encounter: Payer: Self-pay | Admitting: Internal Medicine

## 2016-07-08 ENCOUNTER — Encounter: Payer: Self-pay | Admitting: Internal Medicine

## 2016-07-08 ENCOUNTER — Ambulatory Visit (INDEPENDENT_AMBULATORY_CARE_PROVIDER_SITE_OTHER): Payer: Medicare Other | Admitting: Internal Medicine

## 2016-07-08 VITALS — BP 118/76 | HR 72 | Ht 69.0 in | Wt 186.4 lb

## 2016-07-08 DIAGNOSIS — I10 Essential (primary) hypertension: Secondary | ICD-10-CM

## 2016-07-08 DIAGNOSIS — Z950 Presence of cardiac pacemaker: Secondary | ICD-10-CM | POA: Diagnosis not present

## 2016-07-08 DIAGNOSIS — E876 Hypokalemia: Secondary | ICD-10-CM

## 2016-07-08 DIAGNOSIS — I442 Atrioventricular block, complete: Secondary | ICD-10-CM

## 2016-07-08 LAB — CUP PACEART INCLINIC DEVICE CHECK
Brady Statistic AP VS Percent: 19.22 %
Brady Statistic AS VP Percent: 3.62 %
Brady Statistic RA Percent Paced: 19.31 %
Date Time Interrogation Session: 20170922144132
Implantable Lead Implant Date: 20161207
Implantable Lead Implant Date: 20161207
Implantable Lead Model: 5076
Lead Channel Impedance Value: 437 Ohm
Lead Channel Pacing Threshold Amplitude: 0.5 V
Lead Channel Pacing Threshold Amplitude: 0.75 V
Lead Channel Pacing Threshold Pulse Width: 0.4 ms
Lead Channel Sensing Intrinsic Amplitude: 18.5 mV
Lead Channel Setting Pacing Amplitude: 2 V
Lead Channel Setting Pacing Amplitude: 2.5 V
Lead Channel Setting Pacing Pulse Width: 0.4 ms
Lead Channel Setting Sensing Sensitivity: 2.8 mV
MDC IDC LEAD LOCATION: 753859
MDC IDC LEAD LOCATION: 753860
MDC IDC MSMT BATTERY REMAINING LONGEVITY: 113 mo
MDC IDC MSMT BATTERY VOLTAGE: 3.02 V
MDC IDC MSMT LEADCHNL RA IMPEDANCE VALUE: 342 Ohm
MDC IDC MSMT LEADCHNL RA IMPEDANCE VALUE: 475 Ohm
MDC IDC MSMT LEADCHNL RA PACING THRESHOLD PULSEWIDTH: 0.4 ms
MDC IDC MSMT LEADCHNL RA SENSING INTR AMPL: 3.25 mV
MDC IDC MSMT LEADCHNL RV IMPEDANCE VALUE: 532 Ohm
MDC IDC STAT BRADY AP VP PERCENT: 0.1 %
MDC IDC STAT BRADY AS VS PERCENT: 77.06 %
MDC IDC STAT BRADY RV PERCENT PACED: 3.72 %

## 2016-07-08 LAB — BASIC METABOLIC PANEL
BUN: 20 mg/dL (ref 7–25)
CALCIUM: 9.3 mg/dL (ref 8.6–10.3)
CO2: 27 mmol/L (ref 20–31)
CREATININE: 0.81 mg/dL (ref 0.70–1.18)
Chloride: 105 mmol/L (ref 98–110)
Glucose, Bld: 92 mg/dL (ref 65–99)
Potassium: 4 mmol/L (ref 3.5–5.3)
Sodium: 142 mmol/L (ref 135–146)

## 2016-07-08 NOTE — Progress Notes (Signed)
On      Patient Care Team: Maury Dus, MD as PCP - General (Family Medicine)   HPI  Alvin Simpson is a 75 y.o. male Seen in follow-up for pacemaker implanted 12/16 after presenting with syncope and found to be in complete heart block. He was initially seen in 2014 at which time he had intermittent complete heart block which resolved with changes in his medications.   His history of coronary artery disease with previously normal LV function and prior bypass surgery  No chest pain or shortness of breath or peripheral edema  2016 EF 60-65% without valvular heart disease.   Blood pressure is been much better controlled with the intercurrent addition of spironolactone. T  12/16 K3.4 12/16  K4.2  PCP has not drawn blood work. He has some daytime somnolence.   Past Medical History:  Diagnosis Date  . Cancer [facility]) prostate   with seeds  . Complete heart block ([place]) 09/23/15   MDT PPM, Dr. Caryl Comes  . Coronary artery disease   . Coronary atherosclerosis of native coronary artery 10/13/2013   CABG-[place]-1998  . Hypertension     Past Surgical History:  Procedure Laterality Date  . ANKLE SURGERY    . CARDIAC CATHETERIZATION     18 years ago  . COLONOSCOPY    . CORONARY ARTERY BYPASS GRAFT     2  . CORONARY ARTERY BYPASS GRAFT    . EP IMPLANTABLE DEVICE N/A 09/23/2015   Procedure: Pacemaker Implant;  Surgeon: Deboraha Sprang, MD;  Location: [place] CV [facility];  Service: Cardiovascular;  Laterality: N/A;  . EYE SURGERY Right    cataract  . INSERTION PROSTATE RADIATION SEED    . OPEN REDUCTION INTERNAL FIXATION (ORIF) DISTAL RADIAL FRACTURE Left 07/06/2013   Procedure: OPEN REDUCTION INTERNAL FIXATION (ORIF) LEFT DISTAL RADIAL FRACTURE;  Surgeon: Roseanne Kaufman, MD;  Location: [place];  Service: Orthopedics;  Laterality: Left;    Current Outpatient Prescriptions  Medication Sig Dispense Refill  . amLODipine (NORVASC) 10 MG tablet Take 10 mg by mouth daily.    Marland Kitchen aspirin 81 MG  chewable tablet Chew 81 mg by mouth daily.    Marland Kitchen atorvastatin (LIPITOR) 20 MG tablet Take 20 mg by mouth every evening.    . Coenzyme Q10 (COQ10 PO) Take 1 capsule by mouth every morning.     Marland Kitchen doxazosin (CARDURA) 4 MG tablet Take 4 mg by mouth daily.     . hydrALAZINE (APRESOLINE) 10 MG tablet Take 1 tablet (10 mg total) by mouth 3 (three) times daily as needed (For systolic blood pressure (top number) greater than 150). 90 tablet 3  . ibuprofen (ADVIL,MOTRIN) 200 MG tablet Take 200 mg by mouth 2 (two) times daily as needed for headache or moderate pain.    Marland Kitchen irbesartan (AVAPRO) 300 MG tablet Take 300 mg by mouth daily.    . Multiple Vitamins-Minerals (ICAPS AREDS 2) CAPS Take 1 capsule by mouth 2 (two) times daily.    Marland Kitchen spironolactone (ALDACTONE) 25 MG tablet Take 1 tablet (25 mg total) by mouth daily. 90 tablet 1  . TRAVATAN Z 0.004 % SOLN ophthalmic solution Place 1 drop into both eyes every evening.  5   No current facility-administered medications for this visit.     No Known Allergies    Review of Systems negative except from HPI and PMH  Physical Exam BP 118/76   Pulse 72   Ht 5\' 9"  (1.753 m)   Wt 186 lb  6.4 oz (84.6 kg)   SpO2 94%   BMI 27.53 kg/m  Well developed and well nourished in no acute distress HENT normal E scleral and icterus clear Neck Supple JVP flat; carotids brisk and full Clear to ausculation Device pocket well healed; without hematoma or erythema.  There is no tethering   Regular rate and rhythm, no murmurs gallops or rub Soft with active bowel sounds No clubbing cyanosis   Edema Alert and oriented, grossly normal motor and sensory function Skin Warm and Dry  ECG demonstrates sinus rhythm 66 .19/17/45 RBBB/LAFB  Assessment and  Plan  Intermittent complete heart block  Hypertension  Daytime somnolence  High Risk Medication Surveillance  Pacemaker-Medtronic The patient's device was interrogated.  The information was reviewed. No changes  were made in the programming.     BP well controlled   Without symptoms of ischemia  He has noted some daytime somnolence. He will talk with his wife as to whether he has symptoms to support a diagnosis of sleep apnea. In the event that he does, particularly with his significant hypertension, I would have low threshold for pursuing a sleep study.   We have reviewed the importance of potassium monitoring with his complex medical regime including sprironolactone

## 2016-07-08 NOTE — Patient Instructions (Addendum)
Medication Instructions: - Your physician recommends that you continue on your current medications as directed. Please refer to the Current Medication list given to you today.  Labwork: -Your physician recommends that you hve lab work today: BMP  Procedures/Testing: - none ordered  Follow-Up: - Your physician recommends that you schedule a follow-up appointment in: 3 months with Dr. Irish Lack  - Remote monitoring is used to monitor your Pacemaker of ICD from home. This monitoring reduces the number of office visits required to check your device to one time per year. It allows [place] to keep an eye on the functioning of your device to ensure it is working properly. You are scheduled for a device check from home on 10/11/16. You may send your transmission at any time that day. If you have a wireless device, the transmission will be sent automatically. After your physician reviews your transmission, you will receive a postcard with your next transmission date.  - Your physician wants you to follow-up in: 15 months with Chanetta Marshall, NP for Dr. Caryl Comes. You will receive a reminder letter in the mail two months in advance. If you don't receive a letter, please call our office to schedule the follow-up appointment.  Any Additional Special Instructions Will Be Listed Below (If Applicable).     If you need a refill on your cardiac medications before your next appointment, please call your pharmacy.

## 2016-07-26 IMAGING — DX DG CHEST 2V
2 series · 2 of 2 positions shown · non-contrast
Comparison: Chest radiograph from 10/12/2013

CLINICAL DATA: Acute onset of shortness of breath and weakness.
Initial encounter.

EXAM:
CHEST  2 VIEW

[chest pa]
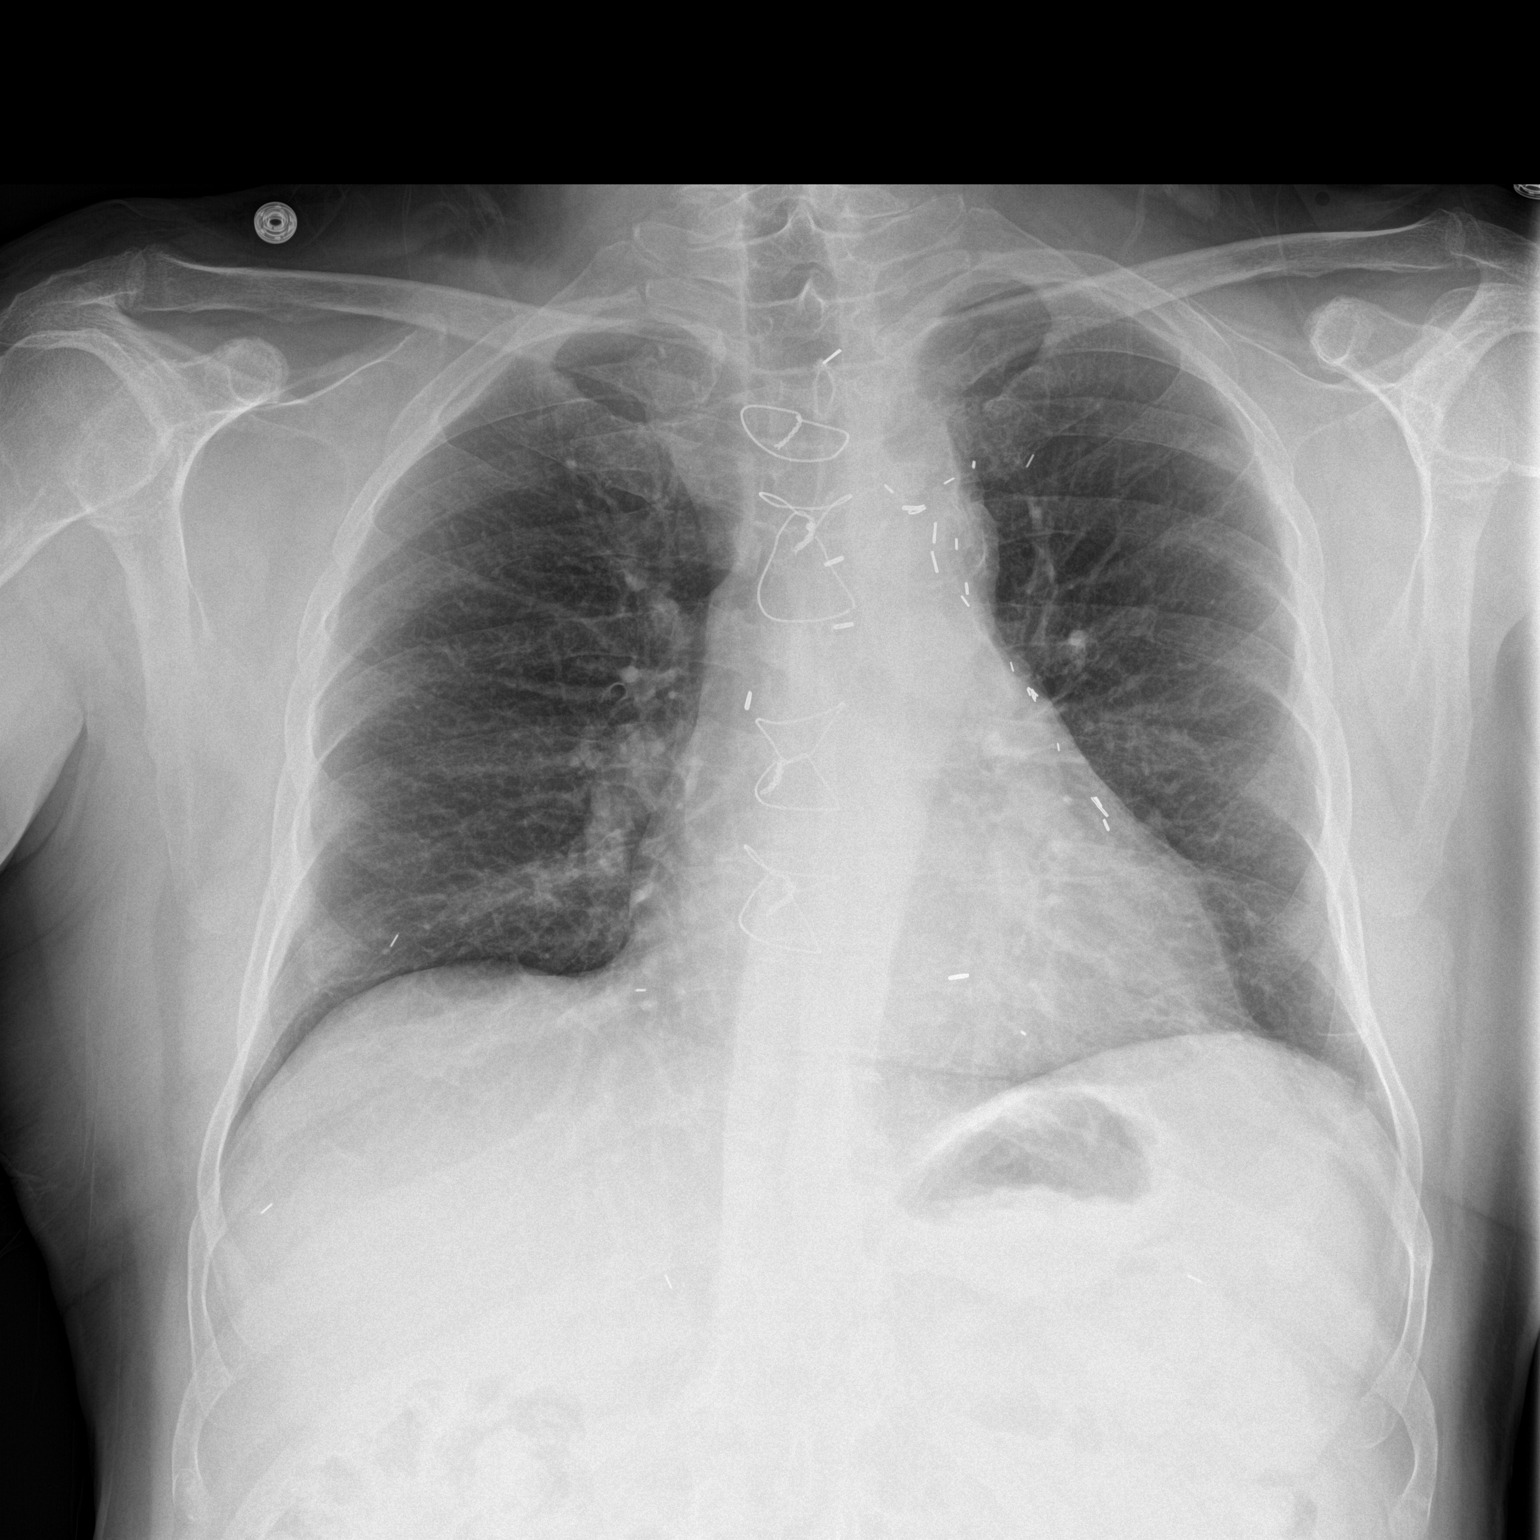

[chest lat]
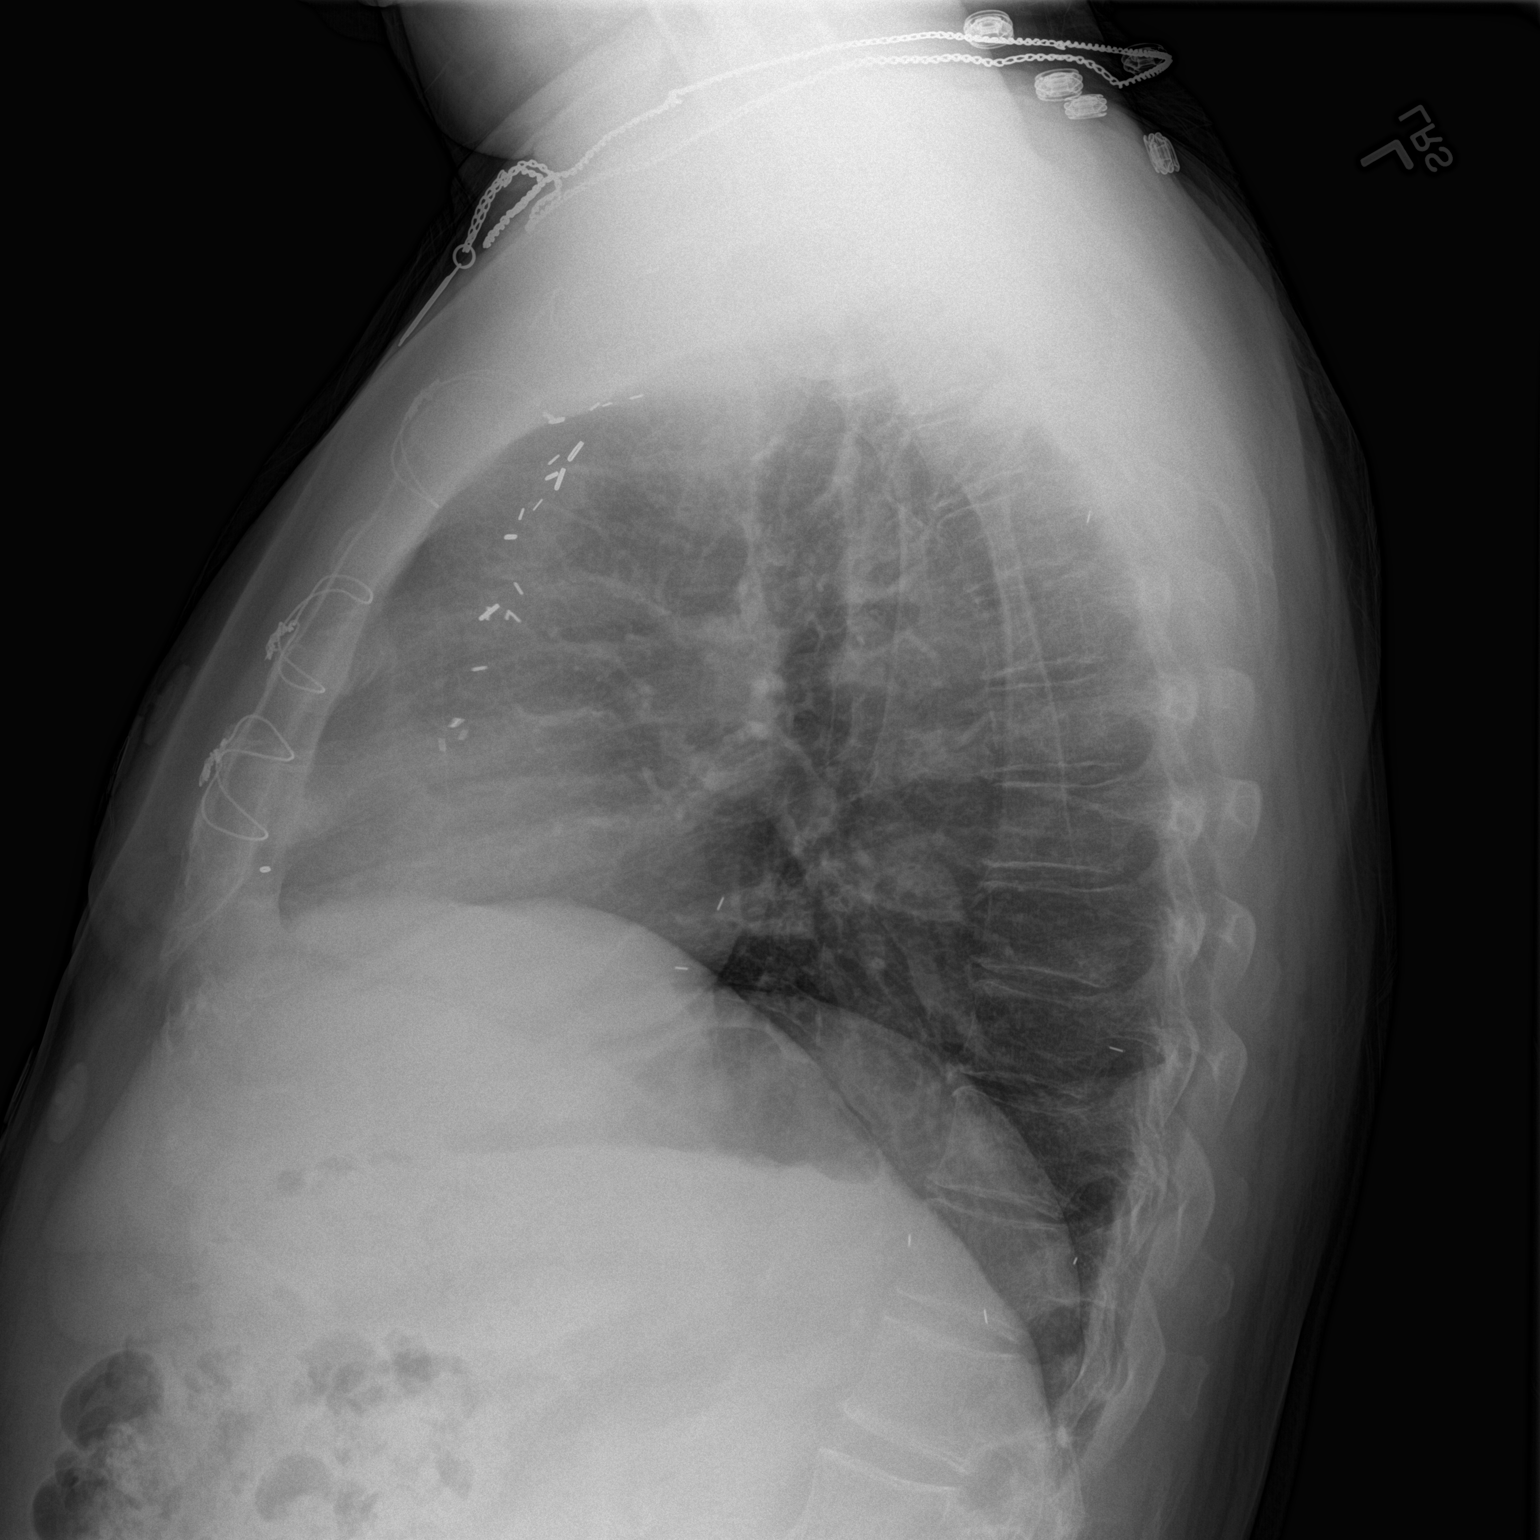

[2 of 2 positions shown; findings below may reference images not displayed]

FINDINGS: The lungs are well-aerated. Peribronchial thickening is noted. There
is no evidence of focal opacification, pleural effusion or
pneumothorax.

The heart is borderline normal in size. The patient is status post
median sternotomy. No acute osseous abnormalities are seen.
IMPRESSION: Peribronchial thickening noted; lungs otherwise clear.

## 2016-10-06 NOTE — Progress Notes (Signed)
Cardiology Office Note   Date:  10/07/2016   ID:  Alvin Simpson, DOB 23-Feb-1941, MRN [id number]  PCP:  Vena Austria, MD    No chief complaint on file. f/u CAD   Wt Readings from Last 3 Encounters:  10/07/16 185 lb (83.9 kg)  07/08/16 186 lb 6.4 oz (84.6 kg)  12/23/15 188 lb 3.2 oz (85.4 kg)       History of Present Illness: Alvin Simpson is a 75 y.o. male  With a h/o CAD, s/p CABG in 1997.  At that time, he had shortness of breath for his anginal equivalent after 50-60 yards.    In the past few years, He had several episodes of syncope and complete heart block.  Eventually, he had  A pacer placed in 2016.  Overall, he has been doing well.  He has not been active due to his wife's health issues.  He has gained some weight.  He has been eating healthy per his description.  "Moderation" is his concept for diet.  He is seeing his PMD next week.  He would like to get his labs checked there.    No anginal sx.    Past Medical History:  Diagnosis Date  . 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 Complete heart block ([place]) 09/23/15   MDT PPM, Dr. Caryl Comes  . Coronary artery disease   . Coronary atherosclerosis of native coronary artery 10/13/2013   CABG-[place]-1998  . Hypertension     Past Surgical History:  Procedure Laterality Date  . ANKLE SURGERY    . CARDIAC CATHETERIZATION     18 years ago  . COLONOSCOPY    . CORONARY ARTERY BYPASS GRAFT     2  . CORONARY ARTERY BYPASS GRAFT    . EP IMPLANTABLE DEVICE N/A 09/23/2015   Procedure: Pacemaker Implant;  Surgeon: Deboraha Sprang, MD;  Location: [place] [facility];  Service: Cardiovascular;  Laterality: N/A;  . EYE SURGERY Right    cataract  . INSERTION PROSTATE RADIATION SEED    . OPEN REDUCTION INTERNAL FIXATION (ORIF) DISTAL RADIAL FRACTURE Left 07/06/2013   Procedure: OPEN REDUCTION INTERNAL FIXATION (ORIF) LEFT DISTAL RADIAL FRACTURE;  Surgeon: Roseanne Kaufman, MD;  Location: [place];  Service: Orthopedics;   Laterality: Left;     Current Outpatient Prescriptions  Medication Sig Dispense Refill  . amLODipine (NORVASC) 10 MG tablet Take 10 mg by mouth daily.    Marland Kitchen aspirin 81 MG chewable tablet Chew 81 mg by mouth daily.    Marland Kitchen atorvastatin (LIPITOR) 20 MG tablet Take 20 mg by mouth every evening.    . brimonidine (ALPHAGAN) 0.15 % ophthalmic solution Place 1 drop into both eyes 3 (three) times daily.    . Coenzyme Q10 (COQ10 PO) Take 1 capsule by mouth every morning.     Marland Kitchen doxazosin (CARDURA) 4 MG tablet Take 4 mg by mouth daily.     . hydrALAZINE (APRESOLINE) 10 MG tablet Take 1 tablet (10 mg total) by mouth 3 (three) times daily as needed (For systolic blood pressure (top number) greater than 150). 90 tablet 3  . ibuprofen (ADVIL,MOTRIN) 200 MG tablet Take 200 mg by mouth 2 (two) times daily as needed for headache or moderate pain.    Marland Kitchen irbesartan (AVAPRO) 300 MG tablet Take 300 mg by mouth daily.    . Multiple Vitamins-Minerals (ICAPS AREDS 2) CAPS Take 1 capsule by mouth 2 (two) times daily.    Marland Kitchen spironolactone (  ALDACTONE) 25 MG tablet Take 1 tablet (25 mg total) by mouth daily. 90 tablet 1  . TRAVATAN Z 0.004 % SOLN ophthalmic solution Place 1 drop into both eyes every evening.  5   No current facility-administered medications for this visit.     Allergies:   Patient has no known allergies.    Social History:  The patient  reports that he quit smoking about 51 years ago. He has never used smokeless tobacco. He reports that he drinks alcohol. He reports that he does not use drugs.   Family History:  The patient's family history includes Cirrhosis in his father; Heart disease in his father; Hypertension in his brother, brother, father, and mother.    ROS:  Please see the history of present illness.   Otherwise, review of systems are positive for stress from his wife's illness.   All other systems are reviewed and negative.    PHYSICAL EXAM: VS:  BP 120/80   Pulse 68   Ht 5\' 9"  (1.753 m)    Wt 185 lb (83.9 kg)   BMI 27.32 kg/m  , BMI Body mass index is 27.32 kg/m. GEN: Well nourished, well developed, in no acute distress  HEENT: normal  Neck: no JVD, carotid bruits, or masses Cardiac: RRR- premature beats; no murmurs, rubs, or gallops,no edema  Respiratory:  clear to auscultation bilaterally, normal work of breathing GI: soft, nontender, nondistended, + BS MS: no deformity or atrophy  Skin: warm and dry, no rash Neuro:  Strength and sensation are intact Psych: euthymic mood, full affect     Recent Labs: 07/08/2016: BUN 20; Creat 0.81; Potassium 4.0; Sodium 142   Lipid Panel    Component Value Date/Time   CHOL 143 10/12/2013 0400   TRIG 80 10/12/2013 0400   HDL 33 (L) 10/12/2013 0400   CHOLHDL 4.3 10/12/2013 0400   VLDL 16 10/12/2013 0400   LDLCALC 94 10/12/2013 0400     Other studies Reviewed: Additional studies/ records that were reviewed today with results demonstrating:hospital records. EF 60-65% in 12/16   ASSESSMENT AND PLAN:  1. CAD: No angina.  COntinue aggressive secondary prevention. 2. Pacer for complete heart block: f/u with Dr. Caryl Comes.  No further syncope. 3. HTN: Continue curent meds.  4. Hyperlipidemia: Continue current lipid lowering therapy.  He will have labs checked with his PMD next week.   5. Increase exercise as below.  We discussed decreasing sugar intake as well.    Current medicines are reviewed at length with the patient today.  The patient concerns regarding his medicines were addressed.  The following changes have been made:  No change   Labs/ tests ordered today include:  No orders of the defined types were placed in this encounter.   Recommend 150 minutes/week of aerobic exercise Low fat, low carb, high fiber diet recommended  Disposition:   FU in 1 year   Signed, Larae Grooms, MD  10/07/2016 8:27 AM    [facility] [place], [place],   [address] Phone: [phone]; Fax:  [phone]

## 2016-10-07 ENCOUNTER — Ambulatory Visit (INDEPENDENT_AMBULATORY_CARE_PROVIDER_SITE_OTHER): Payer: Medicare Other | Admitting: Interventional Cardiology

## 2016-10-07 ENCOUNTER — Encounter: Payer: Self-pay | Admitting: Interventional Cardiology

## 2016-10-07 VITALS — BP 120/80 | HR 68 | Ht 69.0 in | Wt 185.0 lb

## 2016-10-07 DIAGNOSIS — I442 Atrioventricular block, complete: Secondary | ICD-10-CM

## 2016-10-07 DIAGNOSIS — E782 Mixed hyperlipidemia: Secondary | ICD-10-CM | POA: Diagnosis not present

## 2016-10-07 DIAGNOSIS — I10 Essential (primary) hypertension: Secondary | ICD-10-CM | POA: Diagnosis not present

## 2016-10-07 DIAGNOSIS — I2510 Atherosclerotic heart disease of native coronary artery without angina pectoris: Secondary | ICD-10-CM

## 2016-10-07 NOTE — Patient Instructions (Signed)
Medication Instructions:  The current medical regimen is effective;  continue present plan and medications.  Follow-Up: Follow up in 1 year with Dr. Irish Lack.  You will receive a letter in the mail 2 months before you are due.  Please call us when you receive this letter to schedule your follow up appointment.  If you need a refill on your cardiac medications before your next appointment, please call your pharmacy.  Thank you for choosing [facility]!!

## 2016-10-11 ENCOUNTER — Ambulatory Visit (INDEPENDENT_AMBULATORY_CARE_PROVIDER_SITE_OTHER): Payer: Medicare Other | Admitting: *Deleted

## 2016-10-11 DIAGNOSIS — I442 Atrioventricular block, complete: Secondary | ICD-10-CM

## 2016-10-12 ENCOUNTER — Encounter: Payer: Self-pay | Admitting: Internal Medicine

## 2016-10-12 NOTE — Progress Notes (Signed)
Remote pacemaker transmission.   

## 2016-10-13 LAB — CUP PACEART REMOTE DEVICE CHECK
Battery Remaining Longevity: 116 mo
Battery Voltage: 3.02 V
Brady Statistic AP VP Percent: 0.01 %
Brady Statistic AP VS Percent: 18.34 %
Brady Statistic AS VP Percent: 0.04 %
Brady Statistic AS VS Percent: 81.61 %
Brady Statistic RA Percent Paced: 18.33 %
Brady Statistic RV Percent Paced: 0.05 %
Date Time Interrogation Session: 20171226151335
Implantable Lead Implant Date: 20161207
Implantable Lead Implant Date: 20161207
Implantable Lead Location: 753859
Implantable Lead Location: 753860
Implantable Lead Model: 5076
Implantable Lead Model: 5076
Implantable Pulse Generator Implant Date: 20161207
Lead Channel Impedance Value: 342 Ohm
Lead Channel Impedance Value: 437 Ohm
Lead Channel Impedance Value: 475 Ohm
Lead Channel Impedance Value: 513 Ohm
Lead Channel Pacing Threshold Amplitude: 0.5 V
Lead Channel Pacing Threshold Amplitude: 0.75 V
Lead Channel Pacing Threshold Pulse Width: 0.4 ms
Lead Channel Pacing Threshold Pulse Width: 0.4 ms
Lead Channel Sensing Intrinsic Amplitude: 16.375 mV
Lead Channel Sensing Intrinsic Amplitude: 16.375 mV
Lead Channel Sensing Intrinsic Amplitude: 3.75 mV
Lead Channel Sensing Intrinsic Amplitude: 3.75 mV
Lead Channel Setting Pacing Amplitude: 2 V
Lead Channel Setting Pacing Amplitude: 2.5 V
Lead Channel Setting Pacing Pulse Width: 0.4 ms
Lead Channel Setting Sensing Sensitivity: 2.8 mV

## 2016-10-14 ENCOUNTER — Encounter: Payer: Self-pay | Admitting: Cardiology

## 2016-10-27 DIAGNOSIS — H401131 Primary open-angle glaucoma, bilateral, mild stage: Secondary | ICD-10-CM | POA: Diagnosis not present

## 2016-10-27 DIAGNOSIS — H43819 Vitreous degeneration, unspecified eye: Secondary | ICD-10-CM | POA: Diagnosis not present

## 2017-01-10 ENCOUNTER — Ambulatory Visit (INDEPENDENT_AMBULATORY_CARE_PROVIDER_SITE_OTHER): Payer: Medicare HMO | Admitting: *Deleted

## 2017-01-10 ENCOUNTER — Telehealth: Payer: Self-pay | Admitting: Cardiology

## 2017-01-10 DIAGNOSIS — I442 Atrioventricular block, complete: Secondary | ICD-10-CM

## 2017-01-10 NOTE — Telephone Encounter (Signed)
Spoke with pt and reminded pt of remote transmission that is due today. Pt verbalized understanding.   

## 2017-01-11 LAB — CUP PACEART REMOTE DEVICE CHECK
Date Time Interrogation Session: 20180328140143
Implantable Lead Implant Date: 20161207
Implantable Lead Location: 753859
Lead Channel Setting Pacing Amplitude: 2.5 V
Lead Channel Setting Sensing Sensitivity: 2.8 mV
MDC IDC LEAD IMPLANT DT: 20161207
MDC IDC LEAD LOCATION: 753860
MDC IDC PG IMPLANT DT: 20161207
MDC IDC SET LEADCHNL RA PACING AMPLITUDE: 2 V
MDC IDC SET LEADCHNL RV PACING PULSEWIDTH: 0.4 ms

## 2017-01-11 NOTE — Progress Notes (Signed)
Remote pacemaker transmission.   

## 2017-01-13 ENCOUNTER — Encounter: Payer: Self-pay | Admitting: Cardiology

## 2017-01-25 DIAGNOSIS — Z6826 Body mass index (BMI) 26.0-26.9, adult: Secondary | ICD-10-CM | POA: Diagnosis not present

## 2017-01-25 DIAGNOSIS — I10 Essential (primary) hypertension: Secondary | ICD-10-CM | POA: Diagnosis not present

## 2017-01-25 DIAGNOSIS — M25571 Pain in right ankle and joints of right foot: Secondary | ICD-10-CM | POA: Diagnosis not present

## 2017-01-25 DIAGNOSIS — E785 Hyperlipidemia, unspecified: Secondary | ICD-10-CM | POA: Diagnosis not present

## 2017-01-25 DIAGNOSIS — R001 Bradycardia, unspecified: Secondary | ICD-10-CM | POA: Diagnosis not present

## 2017-01-25 DIAGNOSIS — Z79899 Other long term (current) drug therapy: Secondary | ICD-10-CM | POA: Diagnosis not present

## 2017-01-25 DIAGNOSIS — Z7982 Long term (current) use of aspirin: Secondary | ICD-10-CM | POA: Diagnosis not present

## 2017-01-25 DIAGNOSIS — Z950 Presence of cardiac pacemaker: Secondary | ICD-10-CM | POA: Diagnosis not present

## 2017-01-25 DIAGNOSIS — H6122 Impacted cerumen, left ear: Secondary | ICD-10-CM | POA: Diagnosis not present

## 2017-01-25 DIAGNOSIS — Z0000 Encounter for general adult medical examination without abnormal findings: Secondary | ICD-10-CM | POA: Diagnosis not present

## 2017-01-25 DIAGNOSIS — H409 Unspecified glaucoma: Secondary | ICD-10-CM | POA: Diagnosis not present

## 2017-01-26 DIAGNOSIS — H401131 Primary open-angle glaucoma, bilateral, mild stage: Secondary | ICD-10-CM | POA: Diagnosis not present

## 2017-01-26 DIAGNOSIS — H43813 Vitreous degeneration, bilateral: Secondary | ICD-10-CM | POA: Diagnosis not present

## 2017-04-11 ENCOUNTER — Ambulatory Visit (INDEPENDENT_AMBULATORY_CARE_PROVIDER_SITE_OTHER): Payer: Medicare HMO | Admitting: *Deleted

## 2017-04-11 ENCOUNTER — Telehealth: Payer: Self-pay | Admitting: Cardiology

## 2017-04-11 DIAGNOSIS — I442 Atrioventricular block, complete: Secondary | ICD-10-CM | POA: Diagnosis not present

## 2017-04-11 NOTE — Telephone Encounter (Signed)
LMOVM reminding pt to send remote transmission.   

## 2017-04-12 ENCOUNTER — Encounter: Payer: Self-pay | Admitting: Cardiology

## 2017-04-12 LAB — CUP PACEART REMOTE DEVICE CHECK
Battery Remaining Longevity: 105 mo
Brady Statistic AP VS Percent: 19.2 %
Brady Statistic AS VS Percent: 80.65 %
Brady Statistic RV Percent Paced: 0.14 %
Date Time Interrogation Session: 20180627034751
Implantable Lead Implant Date: 20161207
Implantable Lead Location: 753859
Lead Channel Impedance Value: 456 Ohm
Lead Channel Pacing Threshold Amplitude: 0.625 V
Lead Channel Pacing Threshold Amplitude: 0.75 V
Lead Channel Pacing Threshold Pulse Width: 0.4 ms
Lead Channel Sensing Intrinsic Amplitude: 15.625 mV
Lead Channel Sensing Intrinsic Amplitude: 2.875 mV
Lead Channel Sensing Intrinsic Amplitude: 2.875 mV
Lead Channel Setting Pacing Amplitude: 2.5 V
MDC IDC LEAD IMPLANT DT: 20161207
MDC IDC LEAD LOCATION: 753860
MDC IDC MSMT BATTERY VOLTAGE: 3.02 V
MDC IDC MSMT LEADCHNL RA IMPEDANCE VALUE: 342 Ohm
MDC IDC MSMT LEADCHNL RA PACING THRESHOLD PULSEWIDTH: 0.4 ms
MDC IDC MSMT LEADCHNL RV IMPEDANCE VALUE: 418 Ohm
MDC IDC MSMT LEADCHNL RV IMPEDANCE VALUE: 513 Ohm
MDC IDC MSMT LEADCHNL RV SENSING INTR AMPL: 15.625 mV
MDC IDC PG IMPLANT DT: 20161207
MDC IDC SET LEADCHNL RA PACING AMPLITUDE: 2 V
MDC IDC SET LEADCHNL RV PACING PULSEWIDTH: 0.4 ms
MDC IDC SET LEADCHNL RV SENSING SENSITIVITY: 2.8 mV
MDC IDC STAT BRADY AP VP PERCENT: 0.1 %
MDC IDC STAT BRADY AS VP PERCENT: 0.04 %
MDC IDC STAT BRADY RA PERCENT PACED: 19.27 %

## 2017-04-12 NOTE — Progress Notes (Signed)
Remote pacemaker transmission.   

## 2017-05-30 DIAGNOSIS — H401131 Primary open-angle glaucoma, bilateral, mild stage: Secondary | ICD-10-CM | POA: Diagnosis not present

## 2017-07-04 DIAGNOSIS — H401131 Primary open-angle glaucoma, bilateral, mild stage: Secondary | ICD-10-CM | POA: Diagnosis not present

## 2017-07-11 ENCOUNTER — Ambulatory Visit (INDEPENDENT_AMBULATORY_CARE_PROVIDER_SITE_OTHER): Payer: Medicare HMO | Admitting: *Deleted

## 2017-07-11 ENCOUNTER — Telehealth: Payer: Self-pay | Admitting: Cardiology

## 2017-07-11 DIAGNOSIS — I442 Atrioventricular block, complete: Secondary | ICD-10-CM | POA: Diagnosis not present

## 2017-07-11 NOTE — Telephone Encounter (Signed)
LMOVM reminding pt to send remote transmission.   

## 2017-07-11 NOTE — Progress Notes (Signed)
Remote pacemaker transmission.   

## 2017-07-13 ENCOUNTER — Encounter: Payer: Self-pay | Admitting: Cardiology

## 2017-07-13 LAB — CUP PACEART REMOTE DEVICE CHECK
Brady Statistic AP VP Percent: 0.01 %
Brady Statistic AP VS Percent: 14.35 %
Brady Statistic AS VP Percent: 0.04 %
Brady Statistic AS VS Percent: 85.59 %
Brady Statistic RV Percent Paced: 0.06 %
Date Time Interrogation Session: 20180925160612
Implantable Lead Implant Date: 20161207
Implantable Lead Location: 753859
Lead Channel Impedance Value: 437 Ohm
Lead Channel Impedance Value: 456 Ohm
Lead Channel Impedance Value: 513 Ohm
Lead Channel Pacing Threshold Amplitude: 0.625 V
Lead Channel Sensing Intrinsic Amplitude: 14.75 mV
Lead Channel Sensing Intrinsic Amplitude: 14.75 mV
Lead Channel Sensing Intrinsic Amplitude: 3.375 mV
Lead Channel Setting Pacing Amplitude: 2 V
Lead Channel Setting Pacing Amplitude: 2.5 V
Lead Channel Setting Pacing Pulse Width: 0.4 ms
Lead Channel Setting Sensing Sensitivity: 2.8 mV
MDC IDC LEAD IMPLANT DT: 20161207
MDC IDC LEAD LOCATION: 753860
MDC IDC MSMT BATTERY REMAINING LONGEVITY: 104 mo
MDC IDC MSMT BATTERY VOLTAGE: 3.02 V
MDC IDC MSMT LEADCHNL RA IMPEDANCE VALUE: 342 Ohm
MDC IDC MSMT LEADCHNL RA PACING THRESHOLD AMPLITUDE: 0.875 V
MDC IDC MSMT LEADCHNL RA PACING THRESHOLD PULSEWIDTH: 0.4 ms
MDC IDC MSMT LEADCHNL RA SENSING INTR AMPL: 3.375 mV
MDC IDC MSMT LEADCHNL RV PACING THRESHOLD PULSEWIDTH: 0.4 ms
MDC IDC PG IMPLANT DT: 20161207
MDC IDC STAT BRADY RA PERCENT PACED: 14.35 %

## 2017-08-18 DIAGNOSIS — Z23 Encounter for immunization: Secondary | ICD-10-CM | POA: Diagnosis not present

## 2017-08-21 DIAGNOSIS — R69 Illness, unspecified: Secondary | ICD-10-CM | POA: Diagnosis not present

## 2017-10-02 NOTE — Progress Notes (Signed)
Electrophysiology Office Note Date: 10/03/2017  ID:  Alvin Simpson, DOB [birth year], MRN [id number]   PCP: Maury Dus, MD Primary Cardiologist: Irish Lack Electrophysiologist: Caryl Comes  CC: Pacemaker follow-up  Alvin Simpson is a 76 y.o. male seen today for Dr Caryl Comes.  He presents today for routine electrophysiology followup.  Since last being seen in our clinic, the patient reports doing reasonably well.  He and his wife were travelling in [place] when she required emergency bypass surgery. He has been under increased stress because of this.  She is still in [place] at their daughter's house and he is planning to return to [place] later this week.  He denies chest pain, palpitations, dyspnea, PND, orthopnea, nausea, vomiting, dizziness, syncope, edema, weight gain, or early satiety.  Device History: MDT dual chamber PPM implanted 2016 for complete heart block    Past Medical History:  Diagnosis Date  . 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 Complete heart block ([place]) 09/23/15   MDT PPM, Dr. Caryl Comes  . Coronary artery disease   . Coronary atherosclerosis of native coronary artery 10/13/2013   CABG-[place]-1998  . Hypertension    Past Surgical History:  Procedure Laterality Date  . ANKLE SURGERY    . CARDIAC CATHETERIZATION     18 years ago  . COLONOSCOPY    . CORONARY ARTERY BYPASS GRAFT     2  . CORONARY ARTERY BYPASS GRAFT    . EP IMPLANTABLE DEVICE N/A 09/23/2015   Procedure: Pacemaker Implant;  Surgeon: Deboraha Sprang, MD;  Location: [place] [facility];  Service: Cardiovascular;  Laterality: N/A;  . EYE SURGERY Right    cataract  . INSERTION PROSTATE RADIATION SEED    . OPEN REDUCTION INTERNAL FIXATION (ORIF) DISTAL RADIAL FRACTURE Left 07/06/2013   Procedure: OPEN REDUCTION INTERNAL FIXATION (ORIF) LEFT DISTAL RADIAL FRACTURE;  Surgeon: Roseanne Kaufman, MD;  Location: [place];  Service: Orthopedics;  Laterality: Left;    Current Outpatient Medications  Medication Sig Dispense Refill  .  amLODipine (NORVASC) 10 MG tablet Take 10 mg by mouth daily.    Marland Kitchen aspirin 81 MG chewable tablet Chew 81 mg by mouth daily.    Marland Kitchen atorvastatin (LIPITOR) 20 MG tablet Take 20 mg by mouth every evening.    . brimonidine (ALPHAGAN) 0.15 % ophthalmic solution Place 1 drop into both eyes 3 (three) times daily.    . Coenzyme Q10 (COQ10 PO) Take 1 capsule by mouth every morning.     Marland Kitchen doxazosin (CARDURA) 4 MG tablet Take 4 mg by mouth daily.     . hydrALAZINE (APRESOLINE) 10 MG tablet Take 1 tablet (10 mg total) by mouth 3 (three) times daily as needed (For systolic blood pressure (top number) greater than 150). 90 tablet 3  . ibuprofen (ADVIL,MOTRIN) 200 MG tablet Take 200 mg by mouth 2 (two) times daily as needed for headache or moderate pain.    Marland Kitchen irbesartan (AVAPRO) 300 MG tablet Take 300 mg by mouth daily.    . Multiple Vitamins-Minerals (ICAPS AREDS 2) CAPS Take 1 capsule by mouth 2 (two) times daily.    Marland Kitchen spironolactone (ALDACTONE) 25 MG tablet Take 1 tablet (25 mg total) by mouth daily. 90 tablet 1  . TRAVATAN Z 0.004 % SOLN ophthalmic solution Place 1 drop into both eyes every evening.  5   No current facility-administered medications for this visit.     Allergies:   Patient has no known allergies.   Social History:  Social History   Socioeconomic History  . Marital status: Married    Spouse name: Not on file  . Number of children: Not on file  . Years of education: Not on file  . Highest education level: Not on file  Social Needs  . Financial resource strain: Not on file  . Food insecurity - worry: Not on file  . Food insecurity - inability: Not on file  . Transportation needs - medical: Not on file  . Transportation needs - non-medical: Not on file  Occupational History  . Not on file  Tobacco Use  . Smoking status: Former Smoker    Last attempt to quit: 07/05/1965    Years since quitting: 52.2  . Smokeless tobacco: Never Used  Substance and Sexual Activity  . Alcohol use:  Yes    Comment: occasional  . Drug use: No  . Sexual activity: Not on file  Other Topics Concern  . Not on file  Social History Narrative  . Not on file    Family History: Family History  Problem Relation Age of Onset  . Hypertension Mother   . Hypertension Father   . Heart disease Father   . Cirrhosis Father   . Hypertension Brother   . Hypertension Brother      Review of Systems: All other systems reviewed and are otherwise negative except as noted above.   Physical Exam: VS:  BP 137/73   Pulse 60   Ht 5\' 9"  (1.753 m)   Wt 186 lb (84.4 kg)   SpO2 96%   BMI 27.47 kg/m  , BMI Body mass index is 27.47 kg/m.  GEN- The patient is elderly appearing, alert and oriented x 3 today.   HEENT: normocephalic, atraumatic; sclera clear, conjunctiva pink; hearing intact; oropharynx clear; neck supple  Lungs- Clear to ausculation bilaterally, normal work of breathing.  No wheezes, rales, rhonchi Heart- Regular rate and rhythm   GI- soft, non-tender, non-distended, bowel sounds present  Extremities- no clubbing, cyanosis, or edema  MS- no significant deformity or atrophy Skin- warm and dry, no rash or lesion; PPM pocket well healed Psych- euthymic mood, full affect Neuro- strength and sensation are intact  PPM Interrogation- reviewed in detail today,  See PACEART report  EKG:  EKG is ordered today. The ekg ordered today shows sinus rhythm, bifascicular block, rate 60  Recent Labs: No results found for requested labs within last 8760 hours.   Wt Readings from Last 3 Encounters:  10/03/17 186 lb (84.4 kg)  10/07/16 185 lb (83.9 kg)  07/08/16 186 lb 6.4 oz (84.6 kg)     Other studies Reviewed: Additional studies/ records that were reviewed today include: Dr Irish Lack and Dr Olin Pia office notes  Assessment and Plan:  1.  Complete heart block  Normal PPM function See Pace Art report No changes today  2.  HTN Stable No change required today BMET followed by PCP    3.  CAD No recent ischemic symptoms Continue medical therapy   4.  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   The patient does not have concerns regarding his medicines.  The following changes were made today:  none  Labs/ tests ordered today include: none Orders Placed This Encounter  Procedures  . CUP PACEART [place]  . EKG 12-Lead     Disposition:   Follow up with [facility], Dr Caryl Comes 1 year    Signed, Chanetta Marshall, NP 10/03/2017 9:43 AM  [facility] [place]  [place] [address] [phone] (office) [phone] (fax)

## 2017-10-03 ENCOUNTER — Ambulatory Visit: Payer: Medicare HMO | Admitting: Nurse Practitioner

## 2017-10-03 ENCOUNTER — Encounter: Payer: Self-pay | Admitting: Nurse Practitioner

## 2017-10-03 VITALS — BP 137/73 | HR 60 | Ht 69.0 in | Wt 186.0 lb

## 2017-10-03 DIAGNOSIS — I10 Essential (primary) hypertension: Secondary | ICD-10-CM | POA: Diagnosis not present

## 2017-10-03 DIAGNOSIS — I442 Atrioventricular block, complete: Secondary | ICD-10-CM | POA: Diagnosis not present

## 2017-10-03 DIAGNOSIS — I2510 Atherosclerotic heart disease of native coronary artery without angina pectoris: Secondary | ICD-10-CM | POA: Diagnosis not present

## 2017-10-03 LAB — CUP PACEART INCLINIC DEVICE CHECK
Date Time Interrogation Session: 20181218092719
Implantable Lead Implant Date: 20161207
Implantable Lead Location: 753860
MDC IDC LEAD IMPLANT DT: 20161207
MDC IDC LEAD LOCATION: 753859
MDC IDC PG IMPLANT DT: 20161207

## 2017-10-03 NOTE — Patient Instructions (Addendum)
Medication Instructions:   Your physician recommends that you continue on your current medications as directed. Please refer to the Current Medication list given to you today.   If you need a refill on your cardiac medications before your next appointment, please call your pharmacy.  Labwork: NONE ORDERED  TODAY    Testing/Procedures: NONE ORDERED  TODAY     Follow-Up:  Your physician wants you to follow-up in: [place] will receive a reminder letter in the mail two months in advance. If you don't receive a letter, please call our office to schedule the follow-up appointment.    Remote monitoring is used to monitor your Pacemaker of ICD from home. This monitoring reduces the number of office visits required to check your device to one time per year. It allows [place] to keep an eye on the functioning of your device to ensure it is working properly. You are scheduled for a device check from home on . 09-2018 You may send your transmission at any time that day. If you have a wireless device, the transmission will be sent automatically. After your physician reviews your transmission, you will receive a postcard with your next transmission date.     Any Other Special Instructions Will Be Listed Below (If Applicable).

## 2017-10-04 ENCOUNTER — Encounter: Payer: Medicare HMO | Admitting: Nurse Practitioner

## 2017-10-04 DIAGNOSIS — N281 Cyst of kidney, acquired: Secondary | ICD-10-CM | POA: Diagnosis not present

## 2017-10-04 DIAGNOSIS — Z8546 Personal history of malignant neoplasm of prostate: Secondary | ICD-10-CM | POA: Diagnosis not present

## 2017-10-12 ENCOUNTER — Ambulatory Visit (INDEPENDENT_AMBULATORY_CARE_PROVIDER_SITE_OTHER): Payer: Medicare HMO | Admitting: *Deleted

## 2017-10-12 DIAGNOSIS — I442 Atrioventricular block, complete: Secondary | ICD-10-CM

## 2017-10-12 NOTE — Progress Notes (Signed)
Remote pacemaker transmission.   

## 2017-10-13 ENCOUNTER — Telehealth: Payer: Self-pay

## 2017-10-13 ENCOUNTER — Encounter: Payer: Self-pay | Admitting: Cardiology

## 2017-10-13 ENCOUNTER — Other Ambulatory Visit: Payer: Self-pay

## 2017-10-13 LAB — CUP PACEART REMOTE DEVICE CHECK
Battery Remaining Longevity: 103 mo
Battery Voltage: 3.02 V
Brady Statistic AP VS Percent: 17.61 %
Brady Statistic AS VS Percent: 82.34 %
Date Time Interrogation Session: 20181227135422
Implantable Lead Implant Date: 20161207
Implantable Lead Location: 753859
Lead Channel Impedance Value: 456 Ohm
Lead Channel Pacing Threshold Amplitude: 0.5 V
Lead Channel Pacing Threshold Amplitude: 0.875 V
Lead Channel Pacing Threshold Pulse Width: 0.4 ms
Lead Channel Sensing Intrinsic Amplitude: 1.125 mV
Lead Channel Sensing Intrinsic Amplitude: 1.125 mV
Lead Channel Sensing Intrinsic Amplitude: 14.375 mV
Lead Channel Setting Pacing Amplitude: 2.5 V
MDC IDC LEAD IMPLANT DT: 20161207
MDC IDC LEAD LOCATION: 753860
MDC IDC MSMT LEADCHNL RA IMPEDANCE VALUE: 323 Ohm
MDC IDC MSMT LEADCHNL RA PACING THRESHOLD PULSEWIDTH: 0.4 ms
MDC IDC MSMT LEADCHNL RV IMPEDANCE VALUE: 437 Ohm
MDC IDC MSMT LEADCHNL RV IMPEDANCE VALUE: 513 Ohm
MDC IDC MSMT LEADCHNL RV SENSING INTR AMPL: 14.375 mV
MDC IDC PG IMPLANT DT: 20161207
MDC IDC SET LEADCHNL RA PACING AMPLITUDE: 2 V
MDC IDC SET LEADCHNL RV PACING PULSEWIDTH: 0.4 ms
MDC IDC SET LEADCHNL RV SENSING SENSITIVITY: 2.8 mV
MDC IDC STAT BRADY AP VP PERCENT: 0.01 %
MDC IDC STAT BRADY AS VP PERCENT: 0.04 %
MDC IDC STAT BRADY RA PERCENT PACED: 17.35 %
MDC IDC STAT BRADY RV PERCENT PACED: 0.05 %

## 2017-10-13 MED ORDER — METOPROLOL SUCCINATE ER 50 MG PO TB24: 50.0000 mg | ORAL_TABLET | Freq: Every day | ORAL | 3 refills | Status: DC

## 2017-10-13 NOTE — Telephone Encounter (Signed)
Follow up ° ° ° °Patient returning call.  Please call °

## 2017-10-13 NOTE — Telephone Encounter (Signed)
Spoke with patient and relayed Dr. Olin Pia recommendations that he begin taking metoprolol 50mg  daily and that I would schedule an echocardiogram. Patient verbalized understanding but stated that he and his wife were in [place] for the next month or two. I ordered his metoprolol to his requested pharmacy in [place] and will get Dr. Aquilla Hacker recommendations on the timing of the echo. Patient verbalized understanding.

## 2017-10-13 NOTE — Telephone Encounter (Signed)
Spoke with patient regarding remote transmission and increased number of VT episodes. Patient reports feeling "great" and taking all medication as prescribed. I explained that I will review with Dr. Caryl Comes and call back if he has any additional recommendations. Patient verbalizes understanding.

## 2017-10-18 NOTE — Telephone Encounter (Signed)
Duplicate encounter

## 2017-10-18 NOTE — Telephone Encounter (Signed)
Spoke with patient and explained that since he was not established with a health care provider in [place] we would need to wait until he was back in the area to schedule his echo at our office. Patient verbalized understanding and was agreeable to calling the office when he came back to the area.

## 2017-10-18 NOTE — Progress Notes (Deleted)
Cardiology Office Note   Date:  10/18/2017   ID:  Alvin Simpson, DOB 1941/07/04, MRN [id number]  PCP:  Maury Dus, MD    No chief complaint on file.    Wt Readings from Last 3 Encounters:  10/03/17 186 lb (84.4 kg)  10/07/16 185 lb (83.9 kg)  07/08/16 186 lb 6.4 oz (84.6 kg)       History of Present Illness: Alvin Simpson is a 77 y.o. male  With a h/o CAD, s/p CABG in 1997.  At that time, he had shortness of breath for his anginal equivalent after 50-60 yards.    In the past few years, He had several episodes of syncope and complete heart block.  Eventually, he had  A pacer placed in 2016.  Wife recently had an MI and emergency CABG while out of town.    Past Medical History:  Diagnosis Date  . 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 Complete heart block ([place]) 09/23/15   MDT PPM, Dr. Caryl Comes  . Coronary artery disease   . Coronary atherosclerosis of native coronary artery 10/13/2013   CABG-[place]-1998  . Hypertension     Past Surgical History:  Procedure Laterality Date  . ANKLE SURGERY    . CARDIAC CATHETERIZATION     18 years ago  . COLONOSCOPY    . CORONARY ARTERY BYPASS GRAFT     2  . CORONARY ARTERY BYPASS GRAFT    . EP IMPLANTABLE DEVICE N/A 09/23/2015   Procedure: Pacemaker Implant;  Surgeon: Deboraha Sprang, MD;  Location: [place] [facility];  Service: Cardiovascular;  Laterality: N/A;  . EYE SURGERY Right    cataract  . INSERTION PROSTATE RADIATION SEED    . OPEN REDUCTION INTERNAL FIXATION (ORIF) DISTAL RADIAL FRACTURE Left 07/06/2013   Procedure: OPEN REDUCTION INTERNAL FIXATION (ORIF) LEFT DISTAL RADIAL FRACTURE;  Surgeon: Roseanne Kaufman, MD;  Location: [place];  Service: Orthopedics;  Laterality: Left;     Current Outpatient Medications  Medication Sig Dispense Refill  . amLODipine (NORVASC) 10 MG tablet Take 10 mg by mouth daily.    Marland Kitchen aspirin 81 MG chewable tablet Chew 81 mg by mouth daily.    Marland Kitchen atorvastatin (LIPITOR) 20 MG tablet Take 20 mg  by mouth every evening.    . brimonidine (ALPHAGAN) 0.15 % ophthalmic solution Place 1 drop into both eyes 3 (three) times daily.    . Coenzyme Q10 (COQ10 PO) Take 1 capsule by mouth every morning.     Marland Kitchen doxazosin (CARDURA) 4 MG tablet Take 4 mg by mouth daily.     . hydrALAZINE (APRESOLINE) 10 MG tablet Take 1 tablet (10 mg total) by mouth 3 (three) times daily as needed (For systolic blood pressure (top number) greater than 150). 90 tablet 3  . ibuprofen (ADVIL,MOTRIN) 200 MG tablet Take 200 mg by mouth 2 (two) times daily as needed for headache or moderate pain.    Marland Kitchen irbesartan (AVAPRO) 300 MG tablet Take 300 mg by mouth daily.    . metoprolol succinate (TOPROL-XL) 50 MG 24 hr tablet Take 1 tablet (50 mg total) by mouth daily. Take with or immediately following a meal. 90 tablet 3  . Multiple Vitamins-Minerals (ICAPS AREDS 2) CAPS Take 1 capsule by mouth 2 (two) times daily.    Marland Kitchen spironolactone (ALDACTONE) 25 MG tablet Take 1 tablet (25 mg total) by mouth daily. 90 tablet 1  . TRAVATAN Z 0.004 % SOLN ophthalmic solution Place  1 drop into both eyes every evening.  5   No current facility-administered medications for this visit.     Allergies:   Patient has no known allergies.    Social History:  The patient  reports that he quit smoking about 52 years ago. he has never used smokeless tobacco. He reports that he drinks alcohol. He reports that he does not use drugs.   Family History:  The patient's ***family history includes Cirrhosis in his father; Heart disease in his father; Hypertension in his brother, brother, father, and mother.    ROS:  Please see the history of present illness.   Otherwise, review of systems are positive for ***.   All other systems are reviewed and negative.    PHYSICAL EXAM: VS:  There were no vitals taken for this visit. , BMI There is no height or weight on file to calculate BMI. GEN: Well nourished, well developed, in no acute distress  HEENT: normal    Neck: no JVD, carotid bruits, or masses Cardiac: ***RRR; no murmurs, rubs, or gallops,no edema  Respiratory:  clear to auscultation bilaterally, normal work of breathing GI: soft, nontender, nondistended, + BS MS: no deformity or atrophy  Skin: warm and dry, no rash Neuro:  Strength and sensation are intact Psych: euthymic mood, full affect   EKG:   The ekg ordered today demonstrates ***   Recent Labs: No results found for requested labs within last 8760 hours.   Lipid Panel    Component Value Date/Time   CHOL 143 10/12/2013 0400   TRIG 80 10/12/2013 0400   HDL 33 (L) 10/12/2013 0400   CHOLHDL 4.3 10/12/2013 0400   VLDL 16 10/12/2013 0400   LDLCALC 94 10/12/2013 0400     Other studies Reviewed: Additional studies/ records that were reviewed today with results demonstrating: ***.   ASSESSMENT AND PLAN:  1. CAD:  2. Pacer: 3. HTN: 4. Hyperlipidemia:   Current medicines are reviewed at length with the patient today.  The patient concerns regarding his medicines were addressed.  The following changes have been made:  No change***  Labs/ tests ordered today include: *** No orders of the defined types were placed in this encounter.   Recommend 150 minutes/week of aerobic exercise Low fat, low carb, high fiber diet recommended  Disposition:   FU in ***   Signed, Larae Grooms, MD  10/18/2017 10:50 AM    [facility] [place], [place], [place]  [address] Phone: [phone]; Fax: [phone]

## 2017-10-23 ENCOUNTER — Ambulatory Visit: Payer: Medicare HMO | Admitting: Interventional Cardiology

## 2017-11-26 NOTE — Progress Notes (Signed)
Cardiology Office Note   Date:  11/28/2017   ID:  Alvin Simpson, DOB December 19, 1940, MRN [id number]  PCP:  Maury Dus, MD    No chief complaint on file.  CAD  Wt Readings from Last 3 Encounters:  11/28/17 185 lb (83.9 kg)  10/03/17 186 lb (84.4 kg)  10/07/16 185 lb (83.9 kg)       History of Present Illness: Alvin Simpson is a 77 y.o. male  With a h/o CAD, s/p CABG in 1997.  At that time, he had shortness of breath for his anginal equivalent after 50-60 yards.    In the past few years, He had several episodes of syncope and complete heart block.  Eventually, he had  A pacer placed in 2016.  His wife had emergent CABG in [place] in late 2018.  On pacer check in Dec 2018, he had some NSVT at the time of his wife's surgery.  He has followed with [employer].  Toprol was recommended and he ultimately agreed. He is doing ok with this.  He feels that his Toprol caused his initial problems.    Denies : Chest pain. Dizziness. Leg edema. Nitroglycerin use. Orthopnea. Palpitations. Paroxysmal nocturnal dyspnea. Shortness of breath. Syncope.    He stays active, but no regular walking routine.      Past Medical History:  Diagnosis Date  . 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 Complete heart block ([place]) 09/23/15   MDT PPM, Dr. Caryl Comes  . Coronary artery disease   . Coronary atherosclerosis of native coronary artery 10/13/2013   CABG-[place]-1998  . Hypertension     Past Surgical History:  Procedure Laterality Date  . ANKLE SURGERY    . CARDIAC CATHETERIZATION     18 years ago  . COLONOSCOPY    . CORONARY ARTERY BYPASS GRAFT     2  . CORONARY ARTERY BYPASS GRAFT    . EP IMPLANTABLE DEVICE N/A 09/23/2015   Procedure: Pacemaker Implant;  Surgeon: Deboraha Sprang, MD;  Location: [place] [facility];  Service: Cardiovascular;  Laterality: N/A;  . EYE SURGERY Right    cataract  . INSERTION PROSTATE RADIATION SEED    . OPEN REDUCTION INTERNAL FIXATION (ORIF) DISTAL RADIAL FRACTURE Left  07/06/2013   Procedure: OPEN REDUCTION INTERNAL FIXATION (ORIF) LEFT DISTAL RADIAL FRACTURE;  Surgeon: Roseanne Kaufman, MD;  Location: [place];  Service: Orthopedics;  Laterality: Left;     Current Outpatient Medications  Medication Sig Dispense Refill  . amLODipine (NORVASC) 10 MG tablet Take 10 mg by mouth daily.    Marland Kitchen aspirin 81 MG chewable tablet Chew 81 mg by mouth daily.    Marland Kitchen atorvastatin (LIPITOR) 20 MG tablet Take 20 mg by mouth every evening.    . brimonidine (ALPHAGAN) 0.15 % ophthalmic solution Place 1 drop into both eyes 3 (three) times daily.    . Coenzyme Q10 (COQ10 PO) Take 1 capsule by mouth every morning.     Marland Kitchen doxazosin (CARDURA) 4 MG tablet Take 4 mg by mouth daily.     . hydrALAZINE (APRESOLINE) 10 MG tablet Take 1 tablet (10 mg total) by mouth 3 (three) times daily as needed (For systolic blood pressure (top number) greater than 150). 90 tablet 3  . ibuprofen (ADVIL,MOTRIN) 200 MG tablet Take 200 mg by mouth 2 (two) times daily as needed for headache or moderate pain.    Marland Kitchen irbesartan (AVAPRO) 300 MG tablet Take 300 mg by mouth daily.    Marland Kitchen  metoprolol succinate (TOPROL-XL) 50 MG 24 hr tablet Take 1 tablet (50 mg total) by mouth daily. Take with or immediately following a meal. 90 tablet 3  . Multiple Vitamins-Minerals (ICAPS AREDS 2) CAPS Take 1 capsule by mouth 2 (two) times daily.    Marland Kitchen spironolactone (ALDACTONE) 25 MG tablet Take 1 tablet (25 mg total) by mouth daily. 90 tablet 1  . TRAVATAN Z 0.004 % SOLN ophthalmic solution Place 1 drop into both eyes every evening.  5   No current facility-administered medications for this visit.     Allergies:   Patient has no known allergies.    Social History:  The patient  reports that he quit smoking about 52 years ago. he has never used smokeless tobacco. He reports that he drinks alcohol. He reports that he does not use drugs.   Family History:  The patient's family history includes Cirrhosis in his father; Heart disease in his  father; Hypertension in his brother, brother, father, and mother.    ROS:  Please see the history of present illness.   Otherwise, review of systems are positive for red eyes, back pain.   All other systems are reviewed and negative.    PHYSICAL EXAM: VS:  BP 132/86   Pulse 61   Ht 5\' 9"  (1.753 m)   Wt 185 lb (83.9 kg)   SpO2 94%   BMI 27.32 kg/m  , BMI Body mass index is 27.32 kg/m. GEN: Well nourished, well developed, in no acute distress  HEENT: normal ; red eyes Neck: no JVD, carotid bruits, or masses Cardiac: RRR; no murmurs, rubs, or gallops,no edema  Respiratory:  clear to auscultation bilaterally, normal work of breathing GI: soft, nontender, nondistended, + BS MS: no deformity or atrophy  Skin: warm and dry, no rash Neuro:  Strength and sensation are intact Psych: euthymic mood, full affect     Recent Labs: No results found for requested labs within last 8760 hours.   Lipid Panel    Component Value Date/Time   CHOL 143 10/12/2013 0400   TRIG 80 10/12/2013 0400   HDL 33 (L) 10/12/2013 0400   CHOLHDL 4.3 10/12/2013 0400   VLDL 16 10/12/2013 0400   LDLCALC 94 10/12/2013 0400     Other studies Reviewed: Additional studies/ records that were reviewed today with results demonstrating: pacer check findings reviewed.   ASSESSMENT AND PLAN:  1. CAD: No angina on medical therapy.   Continue aggressive secondary prevention.  Check lipids today.  Continue lipid-lowering therapy. 2. Pacer for complete heart block: Back on Toprol for NSVT noted on pacer.   I agree with continuing beta-blocker.  I tried to reassure him due to his concerns. 3. HTN: BP controlled.  Continue current medications.   Current medicines are reviewed at length with the patient today.  The patient concerns regarding his medicines were addressed.  The following changes have been made:  No change  Labs/ tests ordered today include:  No orders of the defined types were placed in this  encounter.   Recommend 150 minutes/week of aerobic exercise Low fat, low carb, high fiber diet recommended  Disposition:   FU in 1 year   Signed, Larae Grooms, MD  11/28/2017 10:21 AM    [facility] [address], [place], [place]  [address] Phone: [phone]; Fax: [phone]

## 2017-11-28 ENCOUNTER — Encounter (INDEPENDENT_AMBULATORY_CARE_PROVIDER_SITE_OTHER): Payer: Self-pay

## 2017-11-28 ENCOUNTER — Encounter: Payer: Self-pay | Admitting: Interventional Cardiology

## 2017-11-28 ENCOUNTER — Ambulatory Visit: Payer: Medicare HMO | Admitting: Interventional Cardiology

## 2017-11-28 VITALS — BP 132/86 | HR 61 | Ht 69.0 in | Wt 185.0 lb

## 2017-11-28 DIAGNOSIS — I10 Essential (primary) hypertension: Secondary | ICD-10-CM | POA: Diagnosis not present

## 2017-11-28 DIAGNOSIS — E785 Hyperlipidemia, unspecified: Secondary | ICD-10-CM | POA: Diagnosis not present

## 2017-11-28 DIAGNOSIS — Z950 Presence of cardiac pacemaker: Secondary | ICD-10-CM

## 2017-11-28 DIAGNOSIS — I2510 Atherosclerotic heart disease of native coronary artery without angina pectoris: Secondary | ICD-10-CM

## 2017-11-28 LAB — COMPREHENSIVE METABOLIC PANEL
ALT: 42 IU/L (ref 0–44)
AST: 26 IU/L (ref 0–40)
Albumin/Globulin Ratio: 1.6 (ref 1.2–2.2)
Albumin: 4.6 g/dL (ref 3.5–4.8)
Alkaline Phosphatase: 74 IU/L (ref 39–117)
BILIRUBIN TOTAL: 0.7 mg/dL (ref 0.0–1.2)
BUN/Creatinine Ratio: 16 (ref 10–24)
BUN: 12 mg/dL (ref 8–27)
CALCIUM: 9.4 mg/dL (ref 8.6–10.2)
CHLORIDE: 102 mmol/L (ref 96–106)
CO2: 27 mmol/L (ref 20–29)
Creatinine, Ser: 0.77 mg/dL (ref 0.76–1.27)
GFR calc Af Amer: 102 mL/min/{1.73_m2} (ref >59)
GFR, EST NON AFRICAN AMERICAN: 88 mL/min/{1.73_m2} (ref >59)
GLUCOSE: 105 mg/dL — AB (ref 65–99)
Globulin, Total: 2.8 g/dL (ref 1.5–4.5)
POTASSIUM: 4.4 mmol/L (ref 3.5–5.2)
Sodium: 142 mmol/L (ref 134–144)
TOTAL PROTEIN: 7.4 g/dL (ref 6.0–8.5)

## 2017-11-28 LAB — LIPID PANEL
Chol/HDL Ratio: 3.5 ratio (ref 0.0–5.0)
Cholesterol, Total: 135 mg/dL (ref 100–199)
HDL: 39 mg/dL — AB (ref >39)
LDL Calculated: 78 mg/dL (ref 0–99)
TRIGLYCERIDES: 91 mg/dL (ref 0–149)
VLDL CHOLESTEROL CAL: 18 mg/dL (ref 5–40)

## 2017-11-28 NOTE — Patient Instructions (Signed)
Medication Instructions:  Your physician recommends that you continue on your current medications as directed. Please refer to the Current Medication list given to you today.   Labwork: TODAY: CMET, LIPIDS  Testing/Procedures: None ordered  Follow-Up: Your physician wants you to follow-up in: 1 year with Dr. Varanasi. You will receive a reminder letter in the mail two months in advance. If you don't receive a letter, please call our office to schedule the follow-up appointment.   Any Other Special Instructions Will Be Listed Below (If Applicable).     If you need a refill on your cardiac medications before your next appointment, please call your pharmacy.   

## 2018-01-02 DIAGNOSIS — H401131 Primary open-angle glaucoma, bilateral, mild stage: Secondary | ICD-10-CM | POA: Diagnosis not present

## 2018-01-11 ENCOUNTER — Encounter: Payer: Medicare HMO | Admitting: *Deleted

## 2018-01-12 ENCOUNTER — Encounter: Payer: Self-pay | Admitting: Cardiology

## 2018-01-15 ENCOUNTER — Ambulatory Visit (INDEPENDENT_AMBULATORY_CARE_PROVIDER_SITE_OTHER): Payer: Medicare HMO | Admitting: *Deleted

## 2018-01-15 DIAGNOSIS — I442 Atrioventricular block, complete: Secondary | ICD-10-CM

## 2018-01-15 NOTE — Progress Notes (Signed)
Remote pacemaker transmission.   

## 2018-01-16 ENCOUNTER — Other Ambulatory Visit: Payer: Self-pay | Admitting: Interventional Cardiology

## 2018-01-16 ENCOUNTER — Encounter: Payer: Self-pay | Admitting: Cardiology

## 2018-01-16 MED ORDER — METOPROLOL SUCCINATE ER 50 MG PO TB24: 50.0000 mg | ORAL_TABLET | Freq: Every day | ORAL | 3 refills | Status: AC

## 2018-01-17 LAB — CUP PACEART REMOTE DEVICE CHECK
Brady Statistic AP VS Percent: 76.01 %
Brady Statistic AS VP Percent: 0.09 %
Brady Statistic AS VS Percent: 23.82 %
Brady Statistic RA Percent Paced: 76.02 %
Implantable Lead Location: 753860
Implantable Lead Model: 5076
Lead Channel Impedance Value: 342 Ohm
Lead Channel Pacing Threshold Pulse Width: 0.4 ms
Lead Channel Pacing Threshold Pulse Width: 0.4 ms
Lead Channel Sensing Intrinsic Amplitude: 1.75 mV
Lead Channel Sensing Intrinsic Amplitude: 16.75 mV
Lead Channel Sensing Intrinsic Amplitude: 16.75 mV
Lead Channel Setting Pacing Pulse Width: 0.4 ms
MDC IDC LEAD IMPLANT DT: 20161207
MDC IDC LEAD IMPLANT DT: 20161207
MDC IDC LEAD LOCATION: 753859
MDC IDC MSMT BATTERY REMAINING LONGEVITY: 99 mo
MDC IDC MSMT BATTERY VOLTAGE: 3.02 V
MDC IDC MSMT LEADCHNL RA IMPEDANCE VALUE: 456 Ohm
MDC IDC MSMT LEADCHNL RA PACING THRESHOLD AMPLITUDE: 0.75 V
MDC IDC MSMT LEADCHNL RA SENSING INTR AMPL: 1.75 mV
MDC IDC MSMT LEADCHNL RV IMPEDANCE VALUE: 456 Ohm
MDC IDC MSMT LEADCHNL RV IMPEDANCE VALUE: 513 Ohm
MDC IDC MSMT LEADCHNL RV PACING THRESHOLD AMPLITUDE: 0.5 V
MDC IDC PG IMPLANT DT: 20161207
MDC IDC SESS DTM: 20190331113420
MDC IDC SET LEADCHNL RA PACING AMPLITUDE: 2 V
MDC IDC SET LEADCHNL RV PACING AMPLITUDE: 2.5 V
MDC IDC SET LEADCHNL RV SENSING SENSITIVITY: 2.8 mV
MDC IDC STAT BRADY AP VP PERCENT: 0.07 %
MDC IDC STAT BRADY RV PERCENT PACED: 0.16 %

## 2018-01-29 ENCOUNTER — Telehealth: Payer: Self-pay

## 2018-01-29 ENCOUNTER — Telehealth (HOSPITAL_COMMUNITY): Payer: Self-pay | Admitting: Internal Medicine

## 2018-01-29 ENCOUNTER — Telehealth: Payer: Self-pay | Admitting: *Deleted

## 2018-01-29 DIAGNOSIS — I442 Atrioventricular block, complete: Secondary | ICD-10-CM

## 2018-01-29 DIAGNOSIS — I25709 Atherosclerosis of coronary artery bypass graft(s), unspecified, with unspecified angina pectoris: Secondary | ICD-10-CM

## 2018-01-29 NOTE — Telephone Encounter (Signed)
Orders only

## 2018-01-29 NOTE — Telephone Encounter (Signed)
User: Cherie Dark A Date/time: 01/29/18 10:47 AM  Comment: Called pt and lmsg for patient to CB to get scheduled for an echo.   Context:  Outcome: Left Message  Phone number: [phone] Phone Type: Home Phone  Comm. type: Telephone Call type: Outgoing  Contact: Ranell Patrick Relation to patient: Self

## 2018-01-29 NOTE — Telephone Encounter (Signed)
LMTCB//sss 

## 2018-01-29 NOTE — Telephone Encounter (Signed)
-----   Message from Deboraha Sprang, MD sent at 01/28/2018  5:27 PM EDT ----- Remote reviewed. This remote is abnormal for Ventricular tachycardia up to 12 min in duration Can u arrange an echo please

## 2018-02-01 ENCOUNTER — Ambulatory Visit (HOSPITAL_COMMUNITY): Payer: Medicare HMO | Attending: Cardiology

## 2018-02-01 ENCOUNTER — Other Ambulatory Visit: Payer: Self-pay

## 2018-02-01 DIAGNOSIS — I25709 Atherosclerosis of coronary artery bypass graft(s), unspecified, with unspecified angina pectoris: Secondary | ICD-10-CM | POA: Insufficient documentation

## 2018-02-01 DIAGNOSIS — I442 Atrioventricular block, complete: Secondary | ICD-10-CM | POA: Diagnosis not present

## 2018-02-14 NOTE — Telephone Encounter (Signed)
Echo completed on 4/18.

## 2018-04-16 ENCOUNTER — Telehealth: Payer: Self-pay | Admitting: Cardiology

## 2018-04-16 ENCOUNTER — Encounter: Payer: Medicare HMO | Admitting: *Deleted

## 2018-04-16 NOTE — Telephone Encounter (Signed)
LMOVM reminding pt to send remote transmission.   

## 2018-04-18 ENCOUNTER — Encounter: Payer: Self-pay | Admitting: Cardiology

## 2018-04-25 ENCOUNTER — Ambulatory Visit (INDEPENDENT_AMBULATORY_CARE_PROVIDER_SITE_OTHER): Payer: Medicare HMO | Admitting: *Deleted

## 2018-04-25 DIAGNOSIS — I442 Atrioventricular block, complete: Secondary | ICD-10-CM | POA: Diagnosis not present

## 2018-04-25 NOTE — Progress Notes (Signed)
Remote pacemaker transmission.   

## 2018-05-24 LAB — CUP PACEART REMOTE DEVICE CHECK
Battery Remaining Longevity: 93 mo
Battery Voltage: 3.01 V
Brady Statistic AS VS Percent: 20.03 %
Brady Statistic RV Percent Paced: 17.12 %
Date Time Interrogation Session: 20190710110008
Implantable Lead Implant Date: 20161207
Implantable Lead Location: 753859
Implantable Lead Location: 753860
Implantable Lead Model: 5076
Implantable Pulse Generator Implant Date: 20161207
Lead Channel Impedance Value: 513 Ohm
Lead Channel Pacing Threshold Amplitude: 0.875 V
Lead Channel Pacing Threshold Pulse Width: 0.4 ms
Lead Channel Sensing Intrinsic Amplitude: 19.25 mV
Lead Channel Sensing Intrinsic Amplitude: 3.125 mV
Lead Channel Sensing Intrinsic Amplitude: 3.125 mV
Lead Channel Setting Pacing Amplitude: 2.5 V
Lead Channel Setting Sensing Sensitivity: 2.8 mV
MDC IDC LEAD IMPLANT DT: 20161207
MDC IDC MSMT LEADCHNL RA IMPEDANCE VALUE: 342 Ohm
MDC IDC MSMT LEADCHNL RA IMPEDANCE VALUE: 456 Ohm
MDC IDC MSMT LEADCHNL RV IMPEDANCE VALUE: 437 Ohm
MDC IDC MSMT LEADCHNL RV PACING THRESHOLD AMPLITUDE: 0.875 V
MDC IDC MSMT LEADCHNL RV PACING THRESHOLD PULSEWIDTH: 0.4 ms
MDC IDC MSMT LEADCHNL RV SENSING INTR AMPL: 19.25 mV
MDC IDC SET LEADCHNL RA PACING AMPLITUDE: 2 V
MDC IDC SET LEADCHNL RV PACING PULSEWIDTH: 0.4 ms
MDC IDC STAT BRADY AP VP PERCENT: 10.29 %
MDC IDC STAT BRADY AP VS PERCENT: 62.9 %
MDC IDC STAT BRADY AS VP PERCENT: 6.78 %
MDC IDC STAT BRADY RA PERCENT PACED: 73.19 %

## 2018-06-05 DIAGNOSIS — H43813 Vitreous degeneration, bilateral: Secondary | ICD-10-CM | POA: Diagnosis not present

## 2018-06-05 DIAGNOSIS — H401131 Primary open-angle glaucoma, bilateral, mild stage: Secondary | ICD-10-CM | POA: Diagnosis not present

## 2018-07-25 ENCOUNTER — Encounter: Payer: Medicare HMO | Admitting: *Deleted

## 2018-07-25 ENCOUNTER — Telehealth: Payer: Self-pay

## 2018-07-25 NOTE — Telephone Encounter (Signed)
LMOVM reminding pt to send remote transmission.   

## 2018-07-30 ENCOUNTER — Ambulatory Visit (INDEPENDENT_AMBULATORY_CARE_PROVIDER_SITE_OTHER): Payer: Medicare HMO | Admitting: *Deleted

## 2018-07-30 DIAGNOSIS — I442 Atrioventricular block, complete: Secondary | ICD-10-CM | POA: Diagnosis not present

## 2018-07-31 NOTE — Progress Notes (Signed)
Remote pacemaker transmission.   

## 2018-08-02 ENCOUNTER — Encounter: Payer: Self-pay | Admitting: Cardiology

## 2018-08-28 ENCOUNTER — Encounter: Payer: Self-pay | Admitting: Internal Medicine

## 2018-10-08 DIAGNOSIS — Z8546 Personal history of malignant neoplasm of prostate: Secondary | ICD-10-CM | POA: Diagnosis not present

## 2018-10-11 ENCOUNTER — Ambulatory Visit: Payer: Medicare HMO | Admitting: Internal Medicine

## 2018-10-11 ENCOUNTER — Encounter: Payer: Self-pay | Admitting: Internal Medicine

## 2018-10-11 VITALS — BP 144/86 | HR 60 | Ht 69.0 in | Wt 186.0 lb

## 2018-10-11 DIAGNOSIS — I10 Essential (primary) hypertension: Secondary | ICD-10-CM

## 2018-10-11 DIAGNOSIS — Z950 Presence of cardiac pacemaker: Secondary | ICD-10-CM

## 2018-10-11 DIAGNOSIS — I2510 Atherosclerotic heart disease of native coronary artery without angina pectoris: Secondary | ICD-10-CM | POA: Diagnosis not present

## 2018-10-11 DIAGNOSIS — I442 Atrioventricular block, complete: Secondary | ICD-10-CM

## 2018-10-11 LAB — BASIC METABOLIC PANEL
BUN / CREAT RATIO: 19 (ref 10–24)
BUN: 15 mg/dL (ref 8–27)
CO2: 27 mmol/L (ref 20–29)
CREATININE: 0.78 mg/dL (ref 0.76–1.27)
Calcium: 9.3 mg/dL (ref 8.6–10.2)
Chloride: 98 mmol/L (ref 96–106)
GFR calc Af Amer: 101 mL/min/{1.73_m2} (ref >59)
GFR calc non Af Amer: 87 mL/min/{1.73_m2} (ref >59)
GLUCOSE: 152 mg/dL — AB (ref 65–99)
POTASSIUM: 3.6 mmol/L (ref 3.5–5.2)
SODIUM: 143 mmol/L (ref 134–144)

## 2018-10-11 NOTE — Progress Notes (Signed)
On      Patient Care Team: Maury Dus, MD as PCP - General (Family Medicine) Jettie Booze, MD as PCP - Cardiology (Cardiology)   HPI  Alvin Simpson is a 77 y.o. male Seen in follow-up for pacemaker implanted 12/16 after presenting with syncope and found to be in complete heart block. He was initially seen in 2014 at which time he had intermittent complete heart block which resolved with changes in his medications.   Has had significant problems with hypertension; now on 4 drugs most recently Aldactone added with good response  BP at home 120s  The patient denies chest pain, shortness of breath, nocturnal dyspnea, orthopnea.  There have been no palpitations, lightheadedness or syncope.       Date Cr K Hgb  12/16   3.4->4.2 13.9  2/19 0.77 4.4     DATE TEST EF   9/14 MYOVIEW 73% No Ischemia  10/14 Echo   55-65 %   4/19 Echo   55-65 %             Past Medical History:  Diagnosis Date  . 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 Complete heart block ([place]) 09/23/15   MDT PPM, Dr. Caryl Comes  . Coronary artery disease   . Coronary atherosclerosis of native coronary artery 10/13/2013   [place]ABG-[place]-1998  . Hypertension     Past Surgical History:  Procedure Laterality Date  . ANKLE SURGERY    . CARDIAC CATHETERIZATION     18 years ago  . COLONOSCOPY    . CORONARY ARTERY BYPASS GRAFT     2  . CORONARY ARTERY BYPASS GRAFT    . EP IMPLANTABLE DEVICE N/A 09/23/2015   Procedure: Pacemaker Implant;  Surgeon: Deboraha Sprang, MD;  Location: [place] [facility];  Service: Cardiovascular;  Laterality: N/A;  . EYE SURGERY Right    cataract  . INSERTION PROSTATE RADIATION SEED    . OPEN REDUCTION INTERNAL FIXATION (ORIF) DISTAL RADIAL FRACTURE Left 07/06/2013   Procedure: OPEN REDUCTION INTERNAL FIXATION (ORIF) LEFT DISTAL RADIAL FRACTURE;  Surgeon: Roseanne Kaufman, MD;  Location: [place];  Service: Orthopedics;  Laterality: Left;    Current Outpatient Medications  Medication  Sig Dispense Refill  . amLODipine (NORVASC) 10 MG tablet Take 10 mg by mouth daily.    Marland Kitchen aspirin 81 MG chewable tablet Chew 81 mg by mouth daily.    Marland Kitchen atorvastatin (LIPITOR) 20 MG tablet Take 20 mg by mouth every evening.    . Coenzyme Q10 (COQ10 PO) Take 1 capsule by mouth every morning.     Marland Kitchen doxazosin (CARDURA) 4 MG tablet Take 4 mg by mouth daily.     . hydrALAZINE (APRESOLINE) 10 MG tablet Take 1 tablet (10 mg total) by mouth 3 (three) times daily as needed (For systolic blood pressure (top number) greater than 150). 90 tablet 3  . ibuprofen (ADVIL,MOTRIN) 200 MG tablet Take 200 mg by mouth 2 (two) times daily as needed for headache or moderate pain.    Marland Kitchen irbesartan (AVAPRO) 300 MG tablet Take 300 mg by mouth daily.    . metoprolol succinate (TOPROL-XL) 50 MG 24 hr tablet Take 1 tablet (50 mg total) by mouth daily. Take with or immediately following a meal. 90 tablet 3  . Multiple Vitamins-Minerals (ICAPS AREDS 2) CAPS Take 1 capsule by mouth 2 (two) times daily.    Marland Kitchen spironolactone (ALDACTONE) 25 MG tablet Take 1 tablet (25 mg total) by mouth  daily. 90 tablet 1  . TRAVATAN Z 0.004 % SOLN ophthalmic solution Place 1 drop into both eyes every evening.  5   No current facility-administered medications for this visit.     No Known Allergies    Review of Systems negative except from HPI and PMH  Physical Exam BP (!) 144/86   Pulse 60   Ht 5\' 9"  (1.753 m)   Wt 186 lb (84.4 kg)   SpO2 98%   BMI 27.47 kg/m  Well developed and nourished in no acute distress HENT normal Neck supple with JVP-flat Clear Regular rate and rhythm, no murmurs or gallops Abd-soft with active BS No Clubbing cyanosis 2+edema Skin-warm and dry A & Oriented  Grossly normal sensory and motor function  ECG AV pacing at 60 15/21/51  Assessment and  Plan  Intermittent complete heart block  Vp 25%  Hypertension  Daytime somnolence  Ventricular tachycardia-nonsustained  High Risk Medication  Surveillance  Pacemaker-[employer]onic  The patient's device was interrogated.  The information was reviewed. No changes were made in the programming. Currently MVP, still with Vp  Electrogram morphology of the VA dissociated rhythm is quite similar to sinus.  This suggests the possibility of a junctional tachycardia with VA dissociation or ventricular tachycardia  BP well controlled at home  We have discussed the physiology of heart failure including the importance of salt restriction and fluid restriction and have reviewed sources of dietary salt and water.  Tolerating well

## 2018-10-11 NOTE — Patient Instructions (Addendum)
Medication Instructions:  Your physician recommends that you continue on your current medications as directed. Please refer to the Current Medication list given to you today.  Labwork: You will get lab work today:  BMP  Testing/Procedures: None ordered.  Follow-Up: Your physician wants you to follow-up in: one year with EP APP.   You will receive a reminder letter in the mail two months in advance. If you don't receive a letter, please call our office to schedule the follow-up appointment.  Remote monitoring is used to monitor your Pacemaker from home. This monitoring reduces the number of office visits required to check your device to one time per year. It allows [place] to keep an eye on the functioning of your device to ensure it is working properly. You are scheduled for a device check from home on 10/29/2018. You may send your transmission at any time that day. If you have a wireless device, the transmission will be sent automatically. After your physician reviews your transmission, you will receive a postcard with your next transmission date.  Any Other Special Instructions Will Be Listed Below (If Applicable).  If you need a refill on your cardiac medications before your next appointment, please call your pharmacy.

## 2018-10-12 LAB — CUP PACEART INCLINIC DEVICE CHECK
Battery Remaining Longevity: 84 mo
Battery Voltage: 3.01 V
Brady Statistic AP VP Percent: 15.41 %
Brady Statistic AS VP Percent: 10.24 %
Brady Statistic AS VS Percent: 16.49 %
Brady Statistic RA Percent Paced: 73.26 %
Brady Statistic RV Percent Paced: 25.78 %
Date Time Interrogation Session: 20191226144050
Implantable Lead Implant Date: 20161207
Implantable Lead Implant Date: 20161207
Implantable Lead Location: 753860
Implantable Lead Model: 5076
Implantable Lead Model: 5076
Lead Channel Impedance Value: 342 Ohm
Lead Channel Impedance Value: 456 Ohm
Lead Channel Impedance Value: 494 Ohm
Lead Channel Impedance Value: 551 Ohm
Lead Channel Pacing Threshold Amplitude: 0.625 V
Lead Channel Pacing Threshold Amplitude: 0.875 V
Lead Channel Pacing Threshold Pulse Width: 0.4 ms
Lead Channel Pacing Threshold Pulse Width: 0.4 ms
Lead Channel Sensing Intrinsic Amplitude: 2.125 mV
Lead Channel Sensing Intrinsic Amplitude: 2.25 mV
Lead Channel Sensing Intrinsic Amplitude: 21.375 mV
Lead Channel Sensing Intrinsic Amplitude: 23.375 mV
Lead Channel Setting Pacing Amplitude: 2 V
Lead Channel Setting Pacing Pulse Width: 0.4 ms
Lead Channel Setting Sensing Sensitivity: 2.8 mV
MDC IDC LEAD LOCATION: 753859
MDC IDC PG IMPLANT DT: 20161207
MDC IDC SET LEADCHNL RV PACING AMPLITUDE: 2.5 V
MDC IDC STAT BRADY AP VS PERCENT: 57.86 %

## 2018-10-15 DIAGNOSIS — Z8546 Personal history of malignant neoplasm of prostate: Secondary | ICD-10-CM | POA: Diagnosis not present

## 2018-10-15 DIAGNOSIS — N5201 Erectile dysfunction due to arterial insufficiency: Secondary | ICD-10-CM | POA: Diagnosis not present

## 2018-10-15 DIAGNOSIS — R800 Isolated proteinuria: Secondary | ICD-10-CM | POA: Diagnosis not present

## 2018-10-29 ENCOUNTER — Ambulatory Visit (INDEPENDENT_AMBULATORY_CARE_PROVIDER_SITE_OTHER): Payer: Medicare HMO

## 2018-10-29 DIAGNOSIS — I442 Atrioventricular block, complete: Secondary | ICD-10-CM | POA: Diagnosis not present

## 2018-10-30 NOTE — Progress Notes (Signed)
Remote pacemaker transmission.   

## 2018-11-01 LAB — CUP PACEART REMOTE DEVICE CHECK
Battery Remaining Longevity: 84 mo
Battery Voltage: 3.01 V
Brady Statistic AP VP Percent: 36.28 %
Brady Statistic AP VS Percent: 36.97 %
Brady Statistic AS VP Percent: 23.72 %
Brady Statistic AS VS Percent: 3.03 %
Brady Statistic RA Percent Paced: 73.33 %
Brady Statistic RV Percent Paced: 60.05 %
Date Time Interrogation Session: 20200113151844
Implantable Lead Implant Date: 20161207
Implantable Lead Implant Date: 20161207
Implantable Lead Location: 753859
Implantable Lead Location: 753860
Implantable Lead Model: 5076
Implantable Lead Model: 5076
Implantable Pulse Generator Implant Date: 20161207
Lead Channel Impedance Value: 342 Ohm
Lead Channel Impedance Value: 456 Ohm
Lead Channel Impedance Value: 475 Ohm
Lead Channel Impedance Value: 532 Ohm
Lead Channel Pacing Threshold Amplitude: 0.625 V
Lead Channel Pacing Threshold Amplitude: 0.75 V
Lead Channel Pacing Threshold Pulse Width: 0.4 ms
Lead Channel Pacing Threshold Pulse Width: 0.4 ms
Lead Channel Sensing Intrinsic Amplitude: 20.375 mV
Lead Channel Sensing Intrinsic Amplitude: 20.375 mV
Lead Channel Setting Pacing Amplitude: 2 V
Lead Channel Setting Pacing Amplitude: 2.5 V
Lead Channel Setting Pacing Pulse Width: 0.4 ms
Lead Channel Setting Sensing Sensitivity: 2.8 mV
MDC IDC MSMT LEADCHNL RA SENSING INTR AMPL: 2.25 mV
MDC IDC MSMT LEADCHNL RA SENSING INTR AMPL: 2.25 mV

## 2018-11-07 DIAGNOSIS — E7800 Pure hypercholesterolemia, unspecified: Secondary | ICD-10-CM | POA: Diagnosis not present

## 2018-11-07 DIAGNOSIS — R809 Proteinuria, unspecified: Secondary | ICD-10-CM | POA: Diagnosis not present

## 2018-11-07 DIAGNOSIS — I10 Essential (primary) hypertension: Secondary | ICD-10-CM | POA: Diagnosis not present

## 2018-12-06 DIAGNOSIS — H43813 Vitreous degeneration, bilateral: Secondary | ICD-10-CM | POA: Diagnosis not present

## 2018-12-06 DIAGNOSIS — H401131 Primary open-angle glaucoma, bilateral, mild stage: Secondary | ICD-10-CM | POA: Diagnosis not present

## 2018-12-11 DIAGNOSIS — I10 Essential (primary) hypertension: Secondary | ICD-10-CM | POA: Diagnosis not present

## 2018-12-11 DIAGNOSIS — E7800 Pure hypercholesterolemia, unspecified: Secondary | ICD-10-CM | POA: Diagnosis not present

## 2019-01-28 ENCOUNTER — Other Ambulatory Visit: Payer: Self-pay

## 2019-01-28 ENCOUNTER — Ambulatory Visit (INDEPENDENT_AMBULATORY_CARE_PROVIDER_SITE_OTHER): Payer: Medicare HMO | Admitting: *Deleted

## 2019-01-28 DIAGNOSIS — I442 Atrioventricular block, complete: Secondary | ICD-10-CM

## 2019-01-28 LAB — CUP PACEART REMOTE DEVICE CHECK
Battery Remaining Longevity: 73 mo
Battery Voltage: 3 V
Brady Statistic AP VP Percent: 21 %
Brady Statistic AP VS Percent: 53.59 %
Brady Statistic AS VP Percent: 10.95 %
Brady Statistic AS VS Percent: 14.46 %
Brady Statistic RA Percent Paced: 74.56 %
Brady Statistic RV Percent Paced: 31.93 %
Date Time Interrogation Session: 20200413125434
Implantable Lead Implant Date: 20161207
Implantable Lead Implant Date: 20161207
Implantable Lead Location: 753859
Implantable Lead Location: 753860
Implantable Lead Model: 5076
Implantable Lead Model: 5076
Implantable Pulse Generator Implant Date: 20161207
Lead Channel Impedance Value: 342 Ohm
Lead Channel Impedance Value: 456 Ohm
Lead Channel Impedance Value: 494 Ohm
Lead Channel Impedance Value: 551 Ohm
Lead Channel Pacing Threshold Amplitude: 0.5 V
Lead Channel Pacing Threshold Amplitude: 0.75 V
Lead Channel Pacing Threshold Pulse Width: 0.4 ms
Lead Channel Pacing Threshold Pulse Width: 0.4 ms
Lead Channel Sensing Intrinsic Amplitude: 1.75 mV
Lead Channel Sensing Intrinsic Amplitude: 1.75 mV
Lead Channel Sensing Intrinsic Amplitude: 20.875 mV
Lead Channel Sensing Intrinsic Amplitude: 20.875 mV
Lead Channel Setting Pacing Amplitude: 2 V
Lead Channel Setting Pacing Amplitude: 2.5 V
Lead Channel Setting Pacing Pulse Width: 0.4 ms
Lead Channel Setting Sensing Sensitivity: 2.8 mV

## 2019-01-31 DIAGNOSIS — E785 Hyperlipidemia, unspecified: Secondary | ICD-10-CM | POA: Diagnosis not present

## 2019-02-08 NOTE — Progress Notes (Signed)
Remote pacemaker transmission.   

## 2019-04-10 DIAGNOSIS — M9904 Segmental and somatic dysfunction of sacral region: Secondary | ICD-10-CM | POA: Diagnosis not present

## 2019-04-10 DIAGNOSIS — M5136 Other intervertebral disc degeneration, lumbar region: Secondary | ICD-10-CM | POA: Diagnosis not present

## 2019-04-10 DIAGNOSIS — M7918 Myalgia, other site: Secondary | ICD-10-CM | POA: Diagnosis not present

## 2019-04-10 DIAGNOSIS — M9903 Segmental and somatic dysfunction of lumbar region: Secondary | ICD-10-CM | POA: Diagnosis not present

## 2019-04-11 DIAGNOSIS — M5136 Other intervertebral disc degeneration, lumbar region: Secondary | ICD-10-CM | POA: Diagnosis not present

## 2019-04-11 DIAGNOSIS — M9903 Segmental and somatic dysfunction of lumbar region: Secondary | ICD-10-CM | POA: Diagnosis not present

## 2019-04-11 DIAGNOSIS — H43813 Vitreous degeneration, bilateral: Secondary | ICD-10-CM | POA: Diagnosis not present

## 2019-04-11 DIAGNOSIS — H401131 Primary open-angle glaucoma, bilateral, mild stage: Secondary | ICD-10-CM | POA: Diagnosis not present

## 2019-04-11 DIAGNOSIS — M7918 Myalgia, other site: Secondary | ICD-10-CM | POA: Diagnosis not present

## 2019-04-11 DIAGNOSIS — M9904 Segmental and somatic dysfunction of sacral region: Secondary | ICD-10-CM | POA: Diagnosis not present

## 2019-04-16 DIAGNOSIS — M9904 Segmental and somatic dysfunction of sacral region: Secondary | ICD-10-CM | POA: Diagnosis not present

## 2019-04-16 DIAGNOSIS — M7918 Myalgia, other site: Secondary | ICD-10-CM | POA: Diagnosis not present

## 2019-04-16 DIAGNOSIS — M5136 Other intervertebral disc degeneration, lumbar region: Secondary | ICD-10-CM | POA: Diagnosis not present

## 2019-04-16 DIAGNOSIS — M9903 Segmental and somatic dysfunction of lumbar region: Secondary | ICD-10-CM | POA: Diagnosis not present

## 2019-04-29 ENCOUNTER — Ambulatory Visit (INDEPENDENT_AMBULATORY_CARE_PROVIDER_SITE_OTHER): Payer: Medicare HMO | Admitting: *Deleted

## 2019-04-29 DIAGNOSIS — M7918 Myalgia, other site: Secondary | ICD-10-CM | POA: Diagnosis not present

## 2019-04-29 DIAGNOSIS — M9904 Segmental and somatic dysfunction of sacral region: Secondary | ICD-10-CM | POA: Diagnosis not present

## 2019-04-29 DIAGNOSIS — I442 Atrioventricular block, complete: Secondary | ICD-10-CM

## 2019-04-29 DIAGNOSIS — R55 Syncope and collapse: Secondary | ICD-10-CM

## 2019-04-29 DIAGNOSIS — M5136 Other intervertebral disc degeneration, lumbar region: Secondary | ICD-10-CM | POA: Diagnosis not present

## 2019-04-29 DIAGNOSIS — M9903 Segmental and somatic dysfunction of lumbar region: Secondary | ICD-10-CM | POA: Diagnosis not present

## 2019-04-29 LAB — CUP PACEART REMOTE DEVICE CHECK
Battery Remaining Longevity: 81 mo
Battery Voltage: 3.01 V
Brady Statistic AP VP Percent: 35.91 %
Brady Statistic AP VS Percent: 38.03 %
Brady Statistic AS VP Percent: 17.57 %
Brady Statistic AS VS Percent: 8.49 %
Brady Statistic RA Percent Paced: 73.98 %
Brady Statistic RV Percent Paced: 53.52 %
Date Time Interrogation Session: 20200713105122
Implantable Lead Implant Date: 20161207
Implantable Lead Implant Date: 20161207
Implantable Lead Location: 753859
Implantable Lead Location: 753860
Implantable Lead Model: 5076
Implantable Lead Model: 5076
Implantable Pulse Generator Implant Date: 20161207
Lead Channel Impedance Value: 342 Ohm
Lead Channel Impedance Value: 437 Ohm
Lead Channel Impedance Value: 475 Ohm
Lead Channel Impedance Value: 532 Ohm
Lead Channel Pacing Threshold Amplitude: 0.625 V
Lead Channel Pacing Threshold Amplitude: 0.75 V
Lead Channel Pacing Threshold Pulse Width: 0.4 ms
Lead Channel Pacing Threshold Pulse Width: 0.4 ms
Lead Channel Sensing Intrinsic Amplitude: 17 mV
Lead Channel Sensing Intrinsic Amplitude: 17 mV
Lead Channel Sensing Intrinsic Amplitude: 2.5 mV
Lead Channel Sensing Intrinsic Amplitude: 2.5 mV
Lead Channel Setting Pacing Amplitude: 2 V
Lead Channel Setting Pacing Amplitude: 2.5 V
Lead Channel Setting Pacing Pulse Width: 0.4 ms
Lead Channel Setting Sensing Sensitivity: 2.8 mV

## 2019-05-01 DIAGNOSIS — M9903 Segmental and somatic dysfunction of lumbar region: Secondary | ICD-10-CM | POA: Diagnosis not present

## 2019-05-01 DIAGNOSIS — M5136 Other intervertebral disc degeneration, lumbar region: Secondary | ICD-10-CM | POA: Diagnosis not present

## 2019-05-01 DIAGNOSIS — M9904 Segmental and somatic dysfunction of sacral region: Secondary | ICD-10-CM | POA: Diagnosis not present

## 2019-05-01 DIAGNOSIS — M7918 Myalgia, other site: Secondary | ICD-10-CM | POA: Diagnosis not present

## 2019-05-06 DIAGNOSIS — M5136 Other intervertebral disc degeneration, lumbar region: Secondary | ICD-10-CM | POA: Diagnosis not present

## 2019-05-06 DIAGNOSIS — M9903 Segmental and somatic dysfunction of lumbar region: Secondary | ICD-10-CM | POA: Diagnosis not present

## 2019-05-06 DIAGNOSIS — M7918 Myalgia, other site: Secondary | ICD-10-CM | POA: Diagnosis not present

## 2019-05-06 DIAGNOSIS — M9904 Segmental and somatic dysfunction of sacral region: Secondary | ICD-10-CM | POA: Diagnosis not present

## 2019-05-08 DIAGNOSIS — M5136 Other intervertebral disc degeneration, lumbar region: Secondary | ICD-10-CM | POA: Diagnosis not present

## 2019-05-08 DIAGNOSIS — M9903 Segmental and somatic dysfunction of lumbar region: Secondary | ICD-10-CM | POA: Diagnosis not present

## 2019-05-08 DIAGNOSIS — M9904 Segmental and somatic dysfunction of sacral region: Secondary | ICD-10-CM | POA: Diagnosis not present

## 2019-05-08 DIAGNOSIS — M7918 Myalgia, other site: Secondary | ICD-10-CM | POA: Diagnosis not present

## 2019-05-10 NOTE — Progress Notes (Signed)
Remote pacemaker transmission.   

## 2019-05-14 DIAGNOSIS — M9903 Segmental and somatic dysfunction of lumbar region: Secondary | ICD-10-CM | POA: Diagnosis not present

## 2019-05-14 DIAGNOSIS — M7918 Myalgia, other site: Secondary | ICD-10-CM | POA: Diagnosis not present

## 2019-05-14 DIAGNOSIS — M5136 Other intervertebral disc degeneration, lumbar region: Secondary | ICD-10-CM | POA: Diagnosis not present

## 2019-05-14 DIAGNOSIS — M9904 Segmental and somatic dysfunction of sacral region: Secondary | ICD-10-CM | POA: Diagnosis not present

## 2019-05-20 DIAGNOSIS — H6123 Impacted cerumen, bilateral: Secondary | ICD-10-CM | POA: Diagnosis not present

## 2019-05-20 DIAGNOSIS — H60333 Swimmer's ear, bilateral: Secondary | ICD-10-CM | POA: Diagnosis not present

## 2019-05-22 DIAGNOSIS — M9903 Segmental and somatic dysfunction of lumbar region: Secondary | ICD-10-CM | POA: Diagnosis not present

## 2019-05-22 DIAGNOSIS — M5136 Other intervertebral disc degeneration, lumbar region: Secondary | ICD-10-CM | POA: Diagnosis not present

## 2019-05-22 DIAGNOSIS — M9904 Segmental and somatic dysfunction of sacral region: Secondary | ICD-10-CM | POA: Diagnosis not present

## 2019-05-22 DIAGNOSIS — M7918 Myalgia, other site: Secondary | ICD-10-CM | POA: Diagnosis not present

## 2019-06-05 DIAGNOSIS — M9904 Segmental and somatic dysfunction of sacral region: Secondary | ICD-10-CM | POA: Diagnosis not present

## 2019-06-05 DIAGNOSIS — M9903 Segmental and somatic dysfunction of lumbar region: Secondary | ICD-10-CM | POA: Diagnosis not present

## 2019-06-05 DIAGNOSIS — M5136 Other intervertebral disc degeneration, lumbar region: Secondary | ICD-10-CM | POA: Diagnosis not present

## 2019-06-17 DIAGNOSIS — H6123 Impacted cerumen, bilateral: Secondary | ICD-10-CM | POA: Diagnosis not present

## 2019-06-17 DIAGNOSIS — H60333 Swimmer's ear, bilateral: Secondary | ICD-10-CM | POA: Diagnosis not present

## 2019-07-30 ENCOUNTER — Ambulatory Visit (INDEPENDENT_AMBULATORY_CARE_PROVIDER_SITE_OTHER): Payer: Medicare HMO | Admitting: *Deleted

## 2019-07-30 DIAGNOSIS — R55 Syncope and collapse: Secondary | ICD-10-CM

## 2019-07-30 DIAGNOSIS — I442 Atrioventricular block, complete: Secondary | ICD-10-CM

## 2019-07-31 DIAGNOSIS — E7800 Pure hypercholesterolemia, unspecified: Secondary | ICD-10-CM | POA: Diagnosis not present

## 2019-07-31 DIAGNOSIS — Z1389 Encounter for screening for other disorder: Secondary | ICD-10-CM | POA: Diagnosis not present

## 2019-07-31 DIAGNOSIS — I10 Essential (primary) hypertension: Secondary | ICD-10-CM | POA: Diagnosis not present

## 2019-07-31 DIAGNOSIS — Z13220 Encounter for screening for lipoid disorders: Secondary | ICD-10-CM | POA: Diagnosis not present

## 2019-07-31 DIAGNOSIS — Z0000 Encounter for general adult medical examination without abnormal findings: Secondary | ICD-10-CM | POA: Diagnosis not present

## 2019-07-31 DIAGNOSIS — Z23 Encounter for immunization: Secondary | ICD-10-CM | POA: Diagnosis not present

## 2019-08-09 LAB — CUP PACEART REMOTE DEVICE CHECK
Battery Remaining Longevity: 81 mo
Battery Voltage: 3.01 V
Brady Statistic AP VP Percent: 1.01 %
Brady Statistic AP VS Percent: 68.45 %
Brady Statistic AS VP Percent: 2.08 %
Brady Statistic AS VS Percent: 28.47 %
Brady Statistic RA Percent Paced: 69.43 %
Brady Statistic RV Percent Paced: 3.09 %
Date Time Interrogation Session: 20201015231550
Implantable Lead Implant Date: 20161207
Implantable Lead Implant Date: 20161207
Implantable Lead Location: 753859
Implantable Lead Location: 753860
Implantable Lead Model: 5076
Implantable Lead Model: 5076
Implantable Pulse Generator Implant Date: 20161207
Lead Channel Impedance Value: 342 Ohm
Lead Channel Impedance Value: 456 Ohm
Lead Channel Impedance Value: 494 Ohm
Lead Channel Impedance Value: 551 Ohm
Lead Channel Pacing Threshold Amplitude: 0.5 V
Lead Channel Pacing Threshold Amplitude: 0.75 V
Lead Channel Pacing Threshold Pulse Width: 0.4 ms
Lead Channel Pacing Threshold Pulse Width: 0.4 ms
Lead Channel Sensing Intrinsic Amplitude: 17.875 mV
Lead Channel Sensing Intrinsic Amplitude: 17.875 mV
Lead Channel Sensing Intrinsic Amplitude: 2.25 mV
Lead Channel Sensing Intrinsic Amplitude: 2.25 mV
Lead Channel Setting Pacing Amplitude: 2 V
Lead Channel Setting Pacing Amplitude: 2.5 V
Lead Channel Setting Pacing Pulse Width: 0.4 ms
Lead Channel Setting Sensing Sensitivity: 2.8 mV

## 2019-08-09 NOTE — Progress Notes (Signed)
Remote pacemaker transmission.   

## 2019-08-22 DIAGNOSIS — R69 Illness, unspecified: Secondary | ICD-10-CM | POA: Diagnosis not present

## 2019-10-08 DIAGNOSIS — H401131 Primary open-angle glaucoma, bilateral, mild stage: Secondary | ICD-10-CM | POA: Diagnosis not present

## 2019-10-08 DIAGNOSIS — H43813 Vitreous degeneration, bilateral: Secondary | ICD-10-CM | POA: Diagnosis not present

## 2019-10-16 DIAGNOSIS — Z8546 Personal history of malignant neoplasm of prostate: Secondary | ICD-10-CM | POA: Diagnosis not present

## 2019-10-31 ENCOUNTER — Ambulatory Visit: Payer: Medicare Other | Attending: Internal Medicine

## 2019-10-31 DIAGNOSIS — Z23 Encounter for immunization: Secondary | ICD-10-CM | POA: Insufficient documentation

## 2019-10-31 NOTE — Progress Notes (Signed)
   Covid-19 Vaccination Clinic  Name:  Alvin Simpson    MRN: [id number] DOB: 1940-10-24  10/31/2019  Alvin Simpson was observed post Covid-19 immunization for 15 minutes without incidence. He was provided with Vaccine Information Sheet and instruction to access the V-Safe system.   Alvin Simpson was instructed to call 911 with any severe reactions post vaccine: Marland Kitchen Difficulty breathing  . Swelling of your face and throat  . A fast heartbeat  . A bad rash all over your body  . Dizziness and weakness    Immunizations Administered    Name Date Dose VIS Date Route   Pfizer COVID-19 Vaccine 10/31/2019 12:12 PM 0.3 mL 09/27/2019 Intramuscular   Manufacturer: [place]   Lot: [id number]   [place]: [id number]

## 2019-11-01 ENCOUNTER — Telehealth: Payer: Self-pay

## 2019-11-01 NOTE — Telephone Encounter (Signed)
The pt called to get help trouble shooting his monitor. I conference the pt and [employer]. [employer] is sending him a new monitor.

## 2019-11-07 ENCOUNTER — Ambulatory Visit (INDEPENDENT_AMBULATORY_CARE_PROVIDER_SITE_OTHER): Payer: Medicare Other | Admitting: *Deleted

## 2019-11-07 DIAGNOSIS — I442 Atrioventricular block, complete: Secondary | ICD-10-CM | POA: Diagnosis not present

## 2019-11-07 LAB — CUP PACEART REMOTE DEVICE CHECK
Battery Remaining Longevity: 81 mo
Battery Voltage: 3.01 V
Brady Statistic AP VP Percent: 15.16 %
Brady Statistic AP VS Percent: 57.42 %
Brady Statistic AS VP Percent: 8.37 %
Brady Statistic AS VS Percent: 19.04 %
Brady Statistic RA Percent Paced: 72.58 %
Brady Statistic RV Percent Paced: 23.58 %
Date Time Interrogation Session: 20210121111254
Implantable Lead Implant Date: 20161207
Implantable Lead Implant Date: 20161207
Implantable Lead Location: 753859
Implantable Lead Location: 753860
Implantable Lead Model: 5076
Implantable Lead Model: 5076
Implantable Pulse Generator Implant Date: 20161207
Lead Channel Impedance Value: 323 Ohm
Lead Channel Impedance Value: 437 Ohm
Lead Channel Impedance Value: 475 Ohm
Lead Channel Impedance Value: 551 Ohm
Lead Channel Pacing Threshold Amplitude: 0.625 V
Lead Channel Pacing Threshold Amplitude: 0.75 V
Lead Channel Pacing Threshold Pulse Width: 0.4 ms
Lead Channel Pacing Threshold Pulse Width: 0.4 ms
Lead Channel Sensing Intrinsic Amplitude: 17.25 mV
Lead Channel Sensing Intrinsic Amplitude: 17.25 mV
Lead Channel Sensing Intrinsic Amplitude: 2.25 mV
Lead Channel Sensing Intrinsic Amplitude: 2.25 mV
Lead Channel Setting Pacing Amplitude: 2 V
Lead Channel Setting Pacing Amplitude: 2.5 V
Lead Channel Setting Pacing Pulse Width: 0.4 ms
Lead Channel Setting Sensing Sensitivity: 2.8 mV

## 2019-11-12 NOTE — Telephone Encounter (Signed)
Transmission received 11-07-2019. I added him to the schedule.

## 2019-11-18 ENCOUNTER — Ambulatory Visit: Payer: Medicare Other | Attending: Internal Medicine

## 2019-11-18 DIAGNOSIS — Z23 Encounter for immunization: Secondary | ICD-10-CM | POA: Insufficient documentation

## 2019-11-18 NOTE — Progress Notes (Signed)
   Covid-19 Vaccination Clinic  Name:  Alvin Simpson    MRN: [id number] DOB: 09-17-1941  11/18/2019  Alvin Simpson was observed post Covid-19 immunization for 15 minutes without incidence. He was provided with Vaccine Information Sheet and instruction to access the V-Safe system.   Alvin Simpson was instructed to call 911 with any severe reactions post vaccine: Marland Kitchen Difficulty breathing  . Swelling of your face and throat  . A fast heartbeat  . A bad rash all over your body  . Dizziness and weakness    Immunizations Administered    Name Date Dose VIS Date Route   Pfizer COVID-19 Vaccine 11/18/2019  8:55 AM 0.3 mL 09/27/2019 Intramuscular   Manufacturer: [place]   Lot: [id number]   [place]: [id number]

## 2020-02-05 ENCOUNTER — Ambulatory Visit (INDEPENDENT_AMBULATORY_CARE_PROVIDER_SITE_OTHER): Payer: Medicare Other | Admitting: *Deleted

## 2020-02-05 DIAGNOSIS — I442 Atrioventricular block, complete: Secondary | ICD-10-CM

## 2020-02-05 LAB — CUP PACEART REMOTE DEVICE CHECK
Battery Remaining Longevity: 71 mo
Battery Voltage: 3 V
Brady Statistic AP VP Percent: 2.34 %
Brady Statistic AP VS Percent: 71.97 %
Brady Statistic AS VP Percent: 6.38 %
Brady Statistic AS VS Percent: 19.31 %
Brady Statistic RA Percent Paced: 74.19 %
Brady Statistic RV Percent Paced: 8.83 %
Date Time Interrogation Session: 20210421104827
Implantable Lead Implant Date: 20161207
Implantable Lead Implant Date: 20161207
Implantable Lead Location: 753859
Implantable Lead Location: 753860
Implantable Lead Model: 5076
Implantable Lead Model: 5076
Implantable Pulse Generator Implant Date: 20161207
Lead Channel Impedance Value: 323 Ohm
Lead Channel Impedance Value: 437 Ohm
Lead Channel Impedance Value: 513 Ohm
Lead Channel Impedance Value: 570 Ohm
Lead Channel Pacing Threshold Amplitude: 0.75 V
Lead Channel Pacing Threshold Amplitude: 0.75 V
Lead Channel Pacing Threshold Pulse Width: 0.4 ms
Lead Channel Pacing Threshold Pulse Width: 0.4 ms
Lead Channel Sensing Intrinsic Amplitude: 1.5 mV
Lead Channel Sensing Intrinsic Amplitude: 1.5 mV
Lead Channel Sensing Intrinsic Amplitude: 16.75 mV
Lead Channel Sensing Intrinsic Amplitude: 16.75 mV
Lead Channel Setting Pacing Amplitude: 2 V
Lead Channel Setting Pacing Amplitude: 2.5 V
Lead Channel Setting Pacing Pulse Width: 0.4 ms
Lead Channel Setting Sensing Sensitivity: 2.8 mV

## 2020-02-06 NOTE — Progress Notes (Signed)
PPM Remote  

## 2020-05-06 ENCOUNTER — Ambulatory Visit (INDEPENDENT_AMBULATORY_CARE_PROVIDER_SITE_OTHER): Payer: Medicare Other | Admitting: *Deleted

## 2020-05-06 DIAGNOSIS — I442 Atrioventricular block, complete: Secondary | ICD-10-CM

## 2020-05-07 LAB — CUP PACEART REMOTE DEVICE CHECK
Battery Remaining Longevity: 72 mo
Battery Voltage: 3 V
Brady Statistic AP VP Percent: 4.83 %
Brady Statistic AP VS Percent: 66.21 %
Brady Statistic AS VP Percent: 8.97 %
Brady Statistic AS VS Percent: 19.99 %
Brady Statistic RA Percent Paced: 70.96 %
Brady Statistic RV Percent Paced: 13.97 %
Date Time Interrogation Session: 20210722105245
Implantable Lead Implant Date: 20161207
Implantable Lead Implant Date: 20161207
Implantable Lead Location: 753859
Implantable Lead Location: 753860
Implantable Lead Model: 5076
Implantable Lead Model: 5076
Implantable Pulse Generator Implant Date: 20161207
Lead Channel Impedance Value: 323 Ohm
Lead Channel Impedance Value: 437 Ohm
Lead Channel Impedance Value: 513 Ohm
Lead Channel Impedance Value: 589 Ohm
Lead Channel Pacing Threshold Amplitude: 0.625 V
Lead Channel Pacing Threshold Amplitude: 0.875 V
Lead Channel Pacing Threshold Pulse Width: 0.4 ms
Lead Channel Pacing Threshold Pulse Width: 0.4 ms
Lead Channel Sensing Intrinsic Amplitude: 1.875 mV
Lead Channel Sensing Intrinsic Amplitude: 1.875 mV
Lead Channel Sensing Intrinsic Amplitude: 15.5 mV
Lead Channel Sensing Intrinsic Amplitude: 15.5 mV
Lead Channel Setting Pacing Amplitude: 2 V
Lead Channel Setting Pacing Amplitude: 2.5 V
Lead Channel Setting Pacing Pulse Width: 0.4 ms
Lead Channel Setting Sensing Sensitivity: 2.8 mV

## 2020-05-08 NOTE — Progress Notes (Signed)
Remote pacemaker transmission.   

## 2020-08-05 ENCOUNTER — Ambulatory Visit (INDEPENDENT_AMBULATORY_CARE_PROVIDER_SITE_OTHER): Payer: Medicare Other

## 2020-08-05 DIAGNOSIS — I442 Atrioventricular block, complete: Secondary | ICD-10-CM

## 2020-08-07 LAB — CUP PACEART REMOTE DEVICE CHECK
Battery Remaining Longevity: 61 mo
Battery Voltage: 3 V
Brady Statistic AP VP Percent: 61.1 %
Brady Statistic AP VS Percent: 9.33 %
Brady Statistic AS VP Percent: 24.3 %
Brady Statistic AS VS Percent: 5.28 %
Brady Statistic RA Percent Paced: 70.41 %
Brady Statistic RV Percent Paced: 85.31 %
Date Time Interrogation Session: 20211020112857
Implantable Lead Implant Date: 20161207
Implantable Lead Implant Date: 20161207
Implantable Lead Location: 753859
Implantable Lead Location: 753860
Implantable Lead Model: 5076
Implantable Lead Model: 5076
Implantable Pulse Generator Implant Date: 20161207
Lead Channel Impedance Value: 323 Ohm
Lead Channel Impedance Value: 437 Ohm
Lead Channel Impedance Value: 475 Ohm
Lead Channel Impedance Value: 532 Ohm
Lead Channel Pacing Threshold Amplitude: 0.625 V
Lead Channel Pacing Threshold Amplitude: 0.75 V
Lead Channel Pacing Threshold Pulse Width: 0.4 ms
Lead Channel Pacing Threshold Pulse Width: 0.4 ms
Lead Channel Sensing Intrinsic Amplitude: 16.125 mV
Lead Channel Sensing Intrinsic Amplitude: 16.125 mV
Lead Channel Sensing Intrinsic Amplitude: 2.125 mV
Lead Channel Sensing Intrinsic Amplitude: 2.125 mV
Lead Channel Setting Pacing Amplitude: 2 V
Lead Channel Setting Pacing Amplitude: 2.5 V
Lead Channel Setting Pacing Pulse Width: 0.4 ms
Lead Channel Setting Sensing Sensitivity: 2.8 mV

## 2020-08-11 NOTE — Progress Notes (Signed)
Remote pacemaker transmission.   

## 2020-11-04 ENCOUNTER — Ambulatory Visit (INDEPENDENT_AMBULATORY_CARE_PROVIDER_SITE_OTHER): Payer: Medicare Other

## 2020-11-04 DIAGNOSIS — I442 Atrioventricular block, complete: Secondary | ICD-10-CM

## 2020-11-05 LAB — CUP PACEART REMOTE DEVICE CHECK
Battery Remaining Longevity: 54 mo
Battery Voltage: 2.99 V
Brady Statistic AP VP Percent: 69.24 %
Brady Statistic AP VS Percent: 0 %
Brady Statistic AS VP Percent: 30.75 %
Brady Statistic AS VS Percent: 0 %
Brady Statistic RA Percent Paced: 69.24 %
Brady Statistic RV Percent Paced: 99.99 %
Date Time Interrogation Session: 20220119083921
Implantable Lead Implant Date: 20161207
Implantable Lead Implant Date: 20161207
Implantable Lead Location: 753859
Implantable Lead Location: 753860
Implantable Lead Model: 5076
Implantable Lead Model: 5076
Implantable Pulse Generator Implant Date: 20161207
Lead Channel Impedance Value: 342 Ohm
Lead Channel Impedance Value: 418 Ohm
Lead Channel Impedance Value: 437 Ohm
Lead Channel Impedance Value: 494 Ohm
Lead Channel Pacing Threshold Amplitude: 0.625 V
Lead Channel Pacing Threshold Amplitude: 0.75 V
Lead Channel Pacing Threshold Pulse Width: 0.4 ms
Lead Channel Pacing Threshold Pulse Width: 0.4 ms
Lead Channel Sensing Intrinsic Amplitude: 1.875 mV
Lead Channel Sensing Intrinsic Amplitude: 1.875 mV
Lead Channel Sensing Intrinsic Amplitude: 16.125 mV
Lead Channel Sensing Intrinsic Amplitude: 16.125 mV
Lead Channel Setting Pacing Amplitude: 2 V
Lead Channel Setting Pacing Amplitude: 2.5 V
Lead Channel Setting Pacing Pulse Width: 0.4 ms
Lead Channel Setting Sensing Sensitivity: 2.8 mV

## 2020-11-16 NOTE — Progress Notes (Signed)
Remote pacemaker transmission.   

## 2020-12-08 DIAGNOSIS — H401131 Primary open-angle glaucoma, bilateral, mild stage: Secondary | ICD-10-CM | POA: Diagnosis not present

## 2021-02-03 ENCOUNTER — Ambulatory Visit (INDEPENDENT_AMBULATORY_CARE_PROVIDER_SITE_OTHER): Payer: Medicare Other

## 2021-02-03 DIAGNOSIS — I442 Atrioventricular block, complete: Secondary | ICD-10-CM | POA: Diagnosis not present

## 2021-02-04 LAB — CUP PACEART REMOTE DEVICE CHECK
Battery Remaining Longevity: 54 mo
Battery Voltage: 2.99 V
Brady Statistic AP VP Percent: 61.02 %
Brady Statistic AP VS Percent: 4.89 %
Brady Statistic AS VP Percent: 33.04 %
Brady Statistic AS VS Percent: 1.05 %
Brady Statistic RA Percent Paced: 66.03 %
Brady Statistic RV Percent Paced: 93.82 %
Date Time Interrogation Session: 20220420061242
Implantable Lead Implant Date: 20161207
Implantable Lead Implant Date: 20161207
Implantable Lead Location: 753859
Implantable Lead Location: 753860
Implantable Lead Model: 5076
Implantable Lead Model: 5076
Implantable Pulse Generator Implant Date: 20161207
Lead Channel Impedance Value: 342 Ohm
Lead Channel Impedance Value: 418 Ohm
Lead Channel Impedance Value: 437 Ohm
Lead Channel Impedance Value: 494 Ohm
Lead Channel Pacing Threshold Amplitude: 0.75 V
Lead Channel Pacing Threshold Amplitude: 0.75 V
Lead Channel Pacing Threshold Pulse Width: 0.4 ms
Lead Channel Pacing Threshold Pulse Width: 0.4 ms
Lead Channel Sensing Intrinsic Amplitude: 10.25 mV
Lead Channel Sensing Intrinsic Amplitude: 10.25 mV
Lead Channel Sensing Intrinsic Amplitude: 2 mV
Lead Channel Sensing Intrinsic Amplitude: 2 mV
Lead Channel Setting Pacing Amplitude: 2 V
Lead Channel Setting Pacing Amplitude: 2.5 V
Lead Channel Setting Pacing Pulse Width: 0.4 ms
Lead Channel Setting Sensing Sensitivity: 2.8 mV

## 2021-02-10 DIAGNOSIS — N181 Chronic kidney disease, stage 1: Secondary | ICD-10-CM | POA: Diagnosis not present

## 2021-02-10 DIAGNOSIS — E785 Hyperlipidemia, unspecified: Secondary | ICD-10-CM | POA: Diagnosis not present

## 2021-02-10 DIAGNOSIS — I2510 Atherosclerotic heart disease of native coronary artery without angina pectoris: Secondary | ICD-10-CM | POA: Diagnosis not present

## 2021-02-10 DIAGNOSIS — I10 Essential (primary) hypertension: Secondary | ICD-10-CM | POA: Diagnosis not present

## 2021-02-10 DIAGNOSIS — E7800 Pure hypercholesterolemia, unspecified: Secondary | ICD-10-CM | POA: Diagnosis not present

## 2021-02-22 NOTE — Progress Notes (Signed)
Remote pacemaker transmission.   

## 2021-04-26 DIAGNOSIS — M545 Low back pain, unspecified: Secondary | ICD-10-CM | POA: Diagnosis not present

## 2021-04-26 DIAGNOSIS — R7309 Other abnormal glucose: Secondary | ICD-10-CM | POA: Diagnosis not present

## 2021-04-26 DIAGNOSIS — E7800 Pure hypercholesterolemia, unspecified: Secondary | ICD-10-CM | POA: Diagnosis not present

## 2021-04-26 DIAGNOSIS — I10 Essential (primary) hypertension: Secondary | ICD-10-CM | POA: Diagnosis not present

## 2021-04-26 DIAGNOSIS — R809 Proteinuria, unspecified: Secondary | ICD-10-CM | POA: Diagnosis not present

## 2021-04-26 DIAGNOSIS — R7303 Prediabetes: Secondary | ICD-10-CM | POA: Diagnosis not present

## 2021-05-04 DIAGNOSIS — H401131 Primary open-angle glaucoma, bilateral, mild stage: Secondary | ICD-10-CM | POA: Diagnosis not present

## 2021-05-04 DIAGNOSIS — H2512 Age-related nuclear cataract, left eye: Secondary | ICD-10-CM | POA: Diagnosis not present

## 2021-05-04 DIAGNOSIS — H43813 Vitreous degeneration, bilateral: Secondary | ICD-10-CM | POA: Diagnosis not present

## 2021-05-05 ENCOUNTER — Ambulatory Visit (INDEPENDENT_AMBULATORY_CARE_PROVIDER_SITE_OTHER): Payer: Medicare Other

## 2021-05-05 DIAGNOSIS — I442 Atrioventricular block, complete: Secondary | ICD-10-CM | POA: Diagnosis not present

## 2021-05-10 LAB — CUP PACEART REMOTE DEVICE CHECK
Battery Remaining Longevity: 47 mo
Battery Voltage: 2.98 V
Brady Statistic AP VP Percent: 70.14 %
Brady Statistic AP VS Percent: 0.04 %
Brady Statistic AS VP Percent: 29.81 %
Brady Statistic AS VS Percent: 0.01 %
Brady Statistic RA Percent Paced: 70.18 %
Brady Statistic RV Percent Paced: 99.94 %
Date Time Interrogation Session: 20220722065032
Implantable Lead Implant Date: 20161207
Implantable Lead Implant Date: 20161207
Implantable Lead Location: 753859
Implantable Lead Location: 753860
Implantable Lead Model: 5076
Implantable Lead Model: 5076
Implantable Pulse Generator Implant Date: 20161207
Lead Channel Impedance Value: 323 Ohm
Lead Channel Impedance Value: 399 Ohm
Lead Channel Impedance Value: 418 Ohm
Lead Channel Impedance Value: 494 Ohm
Lead Channel Pacing Threshold Amplitude: 0.625 V
Lead Channel Pacing Threshold Amplitude: 0.75 V
Lead Channel Pacing Threshold Pulse Width: 0.4 ms
Lead Channel Pacing Threshold Pulse Width: 0.4 ms
Lead Channel Sensing Intrinsic Amplitude: 13.875 mV
Lead Channel Sensing Intrinsic Amplitude: 13.875 mV
Lead Channel Sensing Intrinsic Amplitude: 2.375 mV
Lead Channel Sensing Intrinsic Amplitude: 2.375 mV
Lead Channel Setting Pacing Amplitude: 2 V
Lead Channel Setting Pacing Amplitude: 2.5 V
Lead Channel Setting Pacing Pulse Width: 0.4 ms
Lead Channel Setting Sensing Sensitivity: 2.8 mV

## 2021-05-27 NOTE — Progress Notes (Signed)
Remote pacemaker transmission.   

## 2021-08-04 ENCOUNTER — Ambulatory Visit (INDEPENDENT_AMBULATORY_CARE_PROVIDER_SITE_OTHER): Payer: Medicare Other

## 2021-08-04 DIAGNOSIS — I442 Atrioventricular block, complete: Secondary | ICD-10-CM | POA: Diagnosis not present

## 2021-08-05 LAB — CUP PACEART REMOTE DEVICE CHECK
Battery Remaining Longevity: 49 mo
Battery Voltage: 2.98 V
Brady Statistic AP VP Percent: 72.82 %
Brady Statistic AP VS Percent: 0 %
Brady Statistic AS VP Percent: 27.17 %
Brady Statistic AS VS Percent: 0 %
Brady Statistic RA Percent Paced: 72.82 %
Brady Statistic RV Percent Paced: 99.99 %
Date Time Interrogation Session: 20221020103053
Implantable Lead Implant Date: 20161207
Implantable Lead Implant Date: 20161207
Implantable Lead Location: 753859
Implantable Lead Location: 753860
Implantable Lead Model: 5076
Implantable Lead Model: 5076
Implantable Pulse Generator Implant Date: 20161207
Lead Channel Impedance Value: 323 Ohm
Lead Channel Impedance Value: 418 Ohm
Lead Channel Impedance Value: 437 Ohm
Lead Channel Impedance Value: 494 Ohm
Lead Channel Pacing Threshold Amplitude: 0.625 V
Lead Channel Pacing Threshold Amplitude: 0.75 V
Lead Channel Pacing Threshold Pulse Width: 0.4 ms
Lead Channel Pacing Threshold Pulse Width: 0.4 ms
Lead Channel Sensing Intrinsic Amplitude: 2.875 mV
Lead Channel Sensing Intrinsic Amplitude: 2.875 mV
Lead Channel Sensing Intrinsic Amplitude: 7.25 mV
Lead Channel Sensing Intrinsic Amplitude: 7.25 mV
Lead Channel Setting Pacing Amplitude: 2 V
Lead Channel Setting Pacing Amplitude: 2.5 V
Lead Channel Setting Pacing Pulse Width: 0.4 ms
Lead Channel Setting Sensing Sensitivity: 2.8 mV

## 2021-08-09 NOTE — Progress Notes (Signed)
Remote pacemaker transmission.   

## 2021-08-12 ENCOUNTER — Encounter: Payer: Self-pay | Admitting: Internal Medicine

## 2021-08-18 DIAGNOSIS — R7303 Prediabetes: Secondary | ICD-10-CM | POA: Diagnosis not present

## 2021-08-18 DIAGNOSIS — Z1389 Encounter for screening for other disorder: Secondary | ICD-10-CM | POA: Diagnosis not present

## 2021-08-18 DIAGNOSIS — E7800 Pure hypercholesterolemia, unspecified: Secondary | ICD-10-CM | POA: Diagnosis not present

## 2021-08-18 DIAGNOSIS — R809 Proteinuria, unspecified: Secondary | ICD-10-CM | POA: Diagnosis not present

## 2021-08-18 DIAGNOSIS — Z23 Encounter for immunization: Secondary | ICD-10-CM | POA: Diagnosis not present

## 2021-08-18 DIAGNOSIS — M545 Low back pain, unspecified: Secondary | ICD-10-CM | POA: Diagnosis not present

## 2021-08-18 DIAGNOSIS — I10 Essential (primary) hypertension: Secondary | ICD-10-CM | POA: Diagnosis not present

## 2021-08-18 DIAGNOSIS — Z0000 Encounter for general adult medical examination without abnormal findings: Secondary | ICD-10-CM | POA: Diagnosis not present

## 2021-11-03 ENCOUNTER — Ambulatory Visit (INDEPENDENT_AMBULATORY_CARE_PROVIDER_SITE_OTHER): Payer: Medicare Other

## 2021-11-03 DIAGNOSIS — I442 Atrioventricular block, complete: Secondary | ICD-10-CM

## 2021-11-09 ENCOUNTER — Ambulatory Visit (INDEPENDENT_AMBULATORY_CARE_PROVIDER_SITE_OTHER): Payer: Medicare Other

## 2021-11-09 ENCOUNTER — Telehealth: Payer: Self-pay

## 2021-11-09 DIAGNOSIS — I442 Atrioventricular block, complete: Secondary | ICD-10-CM | POA: Diagnosis not present

## 2021-11-09 LAB — CUP PACEART REMOTE DEVICE CHECK
Battery Remaining Longevity: 43 mo
Battery Voltage: 2.98 V
Brady Statistic AP VP Percent: 69.2 %
Brady Statistic AP VS Percent: 0.03 %
Brady Statistic AS VP Percent: 30.76 %
Brady Statistic AS VS Percent: 0.01 %
Brady Statistic RA Percent Paced: 69.23 %
Brady Statistic RV Percent Paced: 99.96 %
Date Time Interrogation Session: 20230124003944
Implantable Lead Implant Date: 20161207
Implantable Lead Implant Date: 20161207
Implantable Lead Location: 753859
Implantable Lead Location: 753860
Implantable Lead Model: 5076
Implantable Lead Model: 5076
Implantable Pulse Generator Implant Date: 20161207
Lead Channel Impedance Value: 323 Ohm
Lead Channel Impedance Value: 399 Ohm
Lead Channel Impedance Value: 418 Ohm
Lead Channel Impedance Value: 475 Ohm
Lead Channel Pacing Threshold Amplitude: 0.75 V
Lead Channel Pacing Threshold Amplitude: 0.75 V
Lead Channel Pacing Threshold Pulse Width: 0.4 ms
Lead Channel Pacing Threshold Pulse Width: 0.4 ms
Lead Channel Sensing Intrinsic Amplitude: 15.625 mV
Lead Channel Sensing Intrinsic Amplitude: 15.625 mV
Lead Channel Sensing Intrinsic Amplitude: 2.25 mV
Lead Channel Sensing Intrinsic Amplitude: 2.25 mV
Lead Channel Setting Pacing Amplitude: 2 V
Lead Channel Setting Pacing Amplitude: 2.5 V
Lead Channel Setting Pacing Pulse Width: 0.4 ms
Lead Channel Setting Sensing Sensitivity: 2.8 mV

## 2021-11-09 NOTE — Telephone Encounter (Signed)
Transmission received this morning reviewed, normal device function.  No issues.    Attempted to return patient phone call, no answer.  LVM (DPR on file) advising transmission was normal.  If monitor came on earlier in the evening it may have been receiving an update.  Transmission was successfully received and shows normal function, no issues.  Provided DC phone number and hours if patient has further questions.

## 2021-11-09 NOTE — Telephone Encounter (Signed)
Pt left voice mail message stating his heart monitor went on at around 10:30 or 11:00.  He sent a report and thinks the monitor turned on and confused why it would turn on.  He would like a return call regarding the monitor and transmission.  Pt is not currently enrolled in [facility] (wife is enrolled in [facility]) and routed to device clinic triage for follow up.

## 2021-11-16 NOTE — Progress Notes (Signed)
Remote pacemaker transmission.   

## 2021-11-19 NOTE — Progress Notes (Signed)
Remote pacemaker transmission.   

## 2022-02-08 ENCOUNTER — Ambulatory Visit (INDEPENDENT_AMBULATORY_CARE_PROVIDER_SITE_OTHER): Payer: Medicare Other

## 2022-02-08 DIAGNOSIS — I442 Atrioventricular block, complete: Secondary | ICD-10-CM

## 2022-02-08 LAB — CUP PACEART REMOTE DEVICE CHECK
Battery Remaining Longevity: 39 mo
Battery Voltage: 2.97 V
Brady Statistic AP VP Percent: 70.43 %
Brady Statistic AP VS Percent: 0.35 %
Brady Statistic AS VP Percent: 29.16 %
Brady Statistic AS VS Percent: 0.05 %
Brady Statistic RA Percent Paced: 70.78 %
Brady Statistic RV Percent Paced: 99.6 %
Date Time Interrogation Session: 20230425075202
Implantable Lead Implant Date: 20161207
Implantable Lead Implant Date: 20161207
Implantable Lead Location: 753859
Implantable Lead Location: 753860
Implantable Lead Model: 5076
Implantable Lead Model: 5076
Implantable Pulse Generator Implant Date: 20161207
Lead Channel Impedance Value: 323 Ohm
Lead Channel Impedance Value: 399 Ohm
Lead Channel Impedance Value: 399 Ohm
Lead Channel Impedance Value: 475 Ohm
Lead Channel Pacing Threshold Amplitude: 0.75 V
Lead Channel Pacing Threshold Amplitude: 0.75 V
Lead Channel Pacing Threshold Pulse Width: 0.4 ms
Lead Channel Pacing Threshold Pulse Width: 0.4 ms
Lead Channel Sensing Intrinsic Amplitude: 1.75 mV
Lead Channel Sensing Intrinsic Amplitude: 1.75 mV
Lead Channel Sensing Intrinsic Amplitude: 14.125 mV
Lead Channel Sensing Intrinsic Amplitude: 14.125 mV
Lead Channel Setting Pacing Amplitude: 2 V
Lead Channel Setting Pacing Amplitude: 2.5 V
Lead Channel Setting Pacing Pulse Width: 0.4 ms
Lead Channel Setting Sensing Sensitivity: 2.8 mV

## 2022-02-24 NOTE — Progress Notes (Signed)
Remote pacemaker transmission.   

## 2022-03-22 DIAGNOSIS — M545 Low back pain, unspecified: Secondary | ICD-10-CM | POA: Diagnosis not present

## 2022-03-22 DIAGNOSIS — R809 Proteinuria, unspecified: Secondary | ICD-10-CM | POA: Diagnosis not present

## 2022-03-22 DIAGNOSIS — R7303 Prediabetes: Secondary | ICD-10-CM | POA: Diagnosis not present

## 2022-03-22 DIAGNOSIS — I10 Essential (primary) hypertension: Secondary | ICD-10-CM | POA: Diagnosis not present

## 2022-03-22 DIAGNOSIS — E7800 Pure hypercholesterolemia, unspecified: Secondary | ICD-10-CM | POA: Diagnosis not present

## 2022-03-22 DIAGNOSIS — H409 Unspecified glaucoma: Secondary | ICD-10-CM | POA: Diagnosis not present

## 2022-05-10 ENCOUNTER — Ambulatory Visit (INDEPENDENT_AMBULATORY_CARE_PROVIDER_SITE_OTHER): Payer: Medicare Other

## 2022-05-10 DIAGNOSIS — I442 Atrioventricular block, complete: Secondary | ICD-10-CM | POA: Diagnosis not present

## 2022-05-10 LAB — CUP PACEART REMOTE DEVICE CHECK
Battery Remaining Longevity: 41 mo
Battery Voltage: 2.96 V
Brady Statistic AP VP Percent: 72.62 %
Brady Statistic AP VS Percent: 0.04 %
Brady Statistic AS VP Percent: 27.34 %
Brady Statistic AS VS Percent: 0 %
Brady Statistic RA Percent Paced: 72.66 %
Brady Statistic RV Percent Paced: 99.95 %
Date Time Interrogation Session: 20230725152051
Implantable Lead Implant Date: 20161207
Implantable Lead Implant Date: 20161207
Implantable Lead Location: 753859
Implantable Lead Location: 753860
Implantable Lead Model: 5076
Implantable Lead Model: 5076
Implantable Pulse Generator Implant Date: 20161207
Lead Channel Impedance Value: 323 Ohm
Lead Channel Impedance Value: 418 Ohm
Lead Channel Impedance Value: 437 Ohm
Lead Channel Impedance Value: 494 Ohm
Lead Channel Pacing Threshold Amplitude: 0.625 V
Lead Channel Pacing Threshold Amplitude: 0.75 V
Lead Channel Pacing Threshold Pulse Width: 0.4 ms
Lead Channel Pacing Threshold Pulse Width: 0.4 ms
Lead Channel Sensing Intrinsic Amplitude: 13.125 mV
Lead Channel Sensing Intrinsic Amplitude: 13.125 mV
Lead Channel Sensing Intrinsic Amplitude: 2.375 mV
Lead Channel Sensing Intrinsic Amplitude: 2.375 mV
Lead Channel Setting Pacing Amplitude: 2 V
Lead Channel Setting Pacing Amplitude: 2.5 V
Lead Channel Setting Pacing Pulse Width: 0.4 ms
Lead Channel Setting Sensing Sensitivity: 2.8 mV

## 2022-06-07 NOTE — Progress Notes (Signed)
Remote pacemaker transmission.   

## 2022-08-09 ENCOUNTER — Ambulatory Visit (INDEPENDENT_AMBULATORY_CARE_PROVIDER_SITE_OTHER): Payer: Medicare Other

## 2022-08-09 DIAGNOSIS — I442 Atrioventricular block, complete: Secondary | ICD-10-CM

## 2022-08-11 LAB — CUP PACEART REMOTE DEVICE CHECK
Battery Remaining Longevity: 35 mo
Battery Voltage: 2.96 V
Brady Statistic AP VP Percent: 73.61 %
Brady Statistic AP VS Percent: 0.02 %
Brady Statistic AS VP Percent: 26.37 %
Brady Statistic AS VS Percent: 0 %
Brady Statistic RA Percent Paced: 73.63 %
Brady Statistic RV Percent Paced: 99.97 %
Date Time Interrogation Session: 20231025140616
Implantable Lead Connection Status: 753985
Implantable Lead Connection Status: 753985
Implantable Lead Implant Date: 20161207
Implantable Lead Implant Date: 20161207
Implantable Lead Location: 753859
Implantable Lead Location: 753860
Implantable Lead Model: 5076
Implantable Lead Model: 5076
Implantable Pulse Generator Implant Date: 20161207
Lead Channel Impedance Value: 323 Ohm
Lead Channel Impedance Value: 418 Ohm
Lead Channel Impedance Value: 418 Ohm
Lead Channel Impedance Value: 494 Ohm
Lead Channel Pacing Threshold Amplitude: 0.625 V
Lead Channel Pacing Threshold Amplitude: 0.625 V
Lead Channel Pacing Threshold Pulse Width: 0.4 ms
Lead Channel Pacing Threshold Pulse Width: 0.4 ms
Lead Channel Sensing Intrinsic Amplitude: 12.25 mV
Lead Channel Sensing Intrinsic Amplitude: 12.25 mV
Lead Channel Sensing Intrinsic Amplitude: 2.75 mV
Lead Channel Sensing Intrinsic Amplitude: 2.75 mV
Lead Channel Setting Pacing Amplitude: 2 V
Lead Channel Setting Pacing Amplitude: 2.5 V
Lead Channel Setting Pacing Pulse Width: 0.4 ms
Lead Channel Setting Sensing Sensitivity: 2.8 mV
Zone Setting Status: 755011
Zone Setting Status: 755011

## 2022-08-29 NOTE — Progress Notes (Signed)
Remote pacemaker transmission.   

## 2022-09-20 DIAGNOSIS — Z0000 Encounter for general adult medical examination without abnormal findings: Secondary | ICD-10-CM | POA: Diagnosis not present

## 2022-09-20 DIAGNOSIS — M545 Low back pain, unspecified: Secondary | ICD-10-CM | POA: Diagnosis not present

## 2022-09-20 DIAGNOSIS — H42 Glaucoma in diseases classified elsewhere: Secondary | ICD-10-CM | POA: Diagnosis not present

## 2022-09-20 DIAGNOSIS — R7303 Prediabetes: Secondary | ICD-10-CM | POA: Diagnosis not present

## 2022-09-20 DIAGNOSIS — E7800 Pure hypercholesterolemia, unspecified: Secondary | ICD-10-CM | POA: Diagnosis not present

## 2022-09-20 DIAGNOSIS — I10 Essential (primary) hypertension: Secondary | ICD-10-CM | POA: Diagnosis not present

## 2022-09-20 DIAGNOSIS — R809 Proteinuria, unspecified: Secondary | ICD-10-CM | POA: Diagnosis not present

## 2022-11-08 ENCOUNTER — Ambulatory Visit: Payer: Medicare Other | Attending: Internal Medicine

## 2022-11-08 DIAGNOSIS — I442 Atrioventricular block, complete: Secondary | ICD-10-CM

## 2022-11-09 LAB — CUP PACEART REMOTE DEVICE CHECK
Battery Remaining Longevity: 33 mo
Battery Voltage: 2.96 V
Brady Statistic AP VP Percent: 69.54 %
Brady Statistic AP VS Percent: 0.03 %
Brady Statistic AS VP Percent: 30.43 %
Brady Statistic AS VS Percent: 0 %
Brady Statistic RA Percent Paced: 69.57 %
Brady Statistic RV Percent Paced: 99.96 %
Date Time Interrogation Session: 20240124134516
Implantable Lead Connection Status: 753985
Implantable Lead Connection Status: 753985
Implantable Lead Implant Date: 20161207
Implantable Lead Implant Date: 20161207
Implantable Lead Location: 753859
Implantable Lead Location: 753860
Implantable Lead Model: 5076
Implantable Lead Model: 5076
Implantable Pulse Generator Implant Date: 20161207
Lead Channel Impedance Value: 342 Ohm
Lead Channel Impedance Value: 418 Ohm
Lead Channel Impedance Value: 418 Ohm
Lead Channel Impedance Value: 494 Ohm
Lead Channel Pacing Threshold Amplitude: 0.75 V
Lead Channel Pacing Threshold Amplitude: 0.75 V
Lead Channel Pacing Threshold Pulse Width: 0.4 ms
Lead Channel Pacing Threshold Pulse Width: 0.4 ms
Lead Channel Sensing Intrinsic Amplitude: 13.5 mV
Lead Channel Sensing Intrinsic Amplitude: 13.5 mV
Lead Channel Sensing Intrinsic Amplitude: 2.625 mV
Lead Channel Sensing Intrinsic Amplitude: 2.625 mV
Lead Channel Setting Pacing Amplitude: 2 V
Lead Channel Setting Pacing Amplitude: 2.5 V
Lead Channel Setting Pacing Pulse Width: 0.4 ms
Lead Channel Setting Sensing Sensitivity: 2.8 mV
Zone Setting Status: 755011
Zone Setting Status: 755011

## 2022-12-06 NOTE — Progress Notes (Signed)
Remote pacemaker transmission.   

## 2023-01-11 DIAGNOSIS — E349 Endocrine disorder, unspecified: Secondary | ICD-10-CM | POA: Diagnosis not present

## 2023-02-07 ENCOUNTER — Ambulatory Visit (INDEPENDENT_AMBULATORY_CARE_PROVIDER_SITE_OTHER): Payer: Medicare Other

## 2023-02-07 DIAGNOSIS — I442 Atrioventricular block, complete: Secondary | ICD-10-CM | POA: Diagnosis not present

## 2023-02-13 LAB — CUP PACEART REMOTE DEVICE CHECK
Battery Remaining Longevity: 31 mo
Battery Voltage: 2.95 V
Brady Statistic AP VP Percent: 68.19 %
Brady Statistic AP VS Percent: 0.08 %
Brady Statistic AS VP Percent: 31.73 %
Brady Statistic AS VS Percent: 0 %
Brady Statistic RA Percent Paced: 68.26 %
Brady Statistic RV Percent Paced: 99.92 %
Date Time Interrogation Session: 20240427110703
Implantable Lead Connection Status: 753985
Implantable Lead Connection Status: 753985
Implantable Lead Implant Date: 20161207
Implantable Lead Implant Date: 20161207
Implantable Lead Location: 753859
Implantable Lead Location: 753860
Implantable Lead Model: 5076
Implantable Lead Model: 5076
Implantable Pulse Generator Implant Date: 20161207
Lead Channel Impedance Value: 342 Ohm
Lead Channel Impedance Value: 418 Ohm
Lead Channel Impedance Value: 437 Ohm
Lead Channel Impedance Value: 494 Ohm
Lead Channel Pacing Threshold Amplitude: 0.75 V
Lead Channel Pacing Threshold Amplitude: 0.75 V
Lead Channel Pacing Threshold Pulse Width: 0.4 ms
Lead Channel Pacing Threshold Pulse Width: 0.4 ms
Lead Channel Sensing Intrinsic Amplitude: 13.125 mV
Lead Channel Sensing Intrinsic Amplitude: 13.125 mV
Lead Channel Sensing Intrinsic Amplitude: 2.625 mV
Lead Channel Sensing Intrinsic Amplitude: 2.625 mV
Lead Channel Setting Pacing Amplitude: 2 V
Lead Channel Setting Pacing Amplitude: 2.5 V
Lead Channel Setting Pacing Pulse Width: 0.4 ms
Lead Channel Setting Sensing Sensitivity: 2.8 mV
Zone Setting Status: 755011
Zone Setting Status: 755011

## 2023-03-08 NOTE — Progress Notes (Signed)
Remote pacemaker transmission.   

## 2023-05-09 ENCOUNTER — Ambulatory Visit (INDEPENDENT_AMBULATORY_CARE_PROVIDER_SITE_OTHER): Payer: Medicare Other

## 2023-05-09 DIAGNOSIS — I442 Atrioventricular block, complete: Secondary | ICD-10-CM

## 2023-05-26 NOTE — Progress Notes (Signed)
Remote pacemaker transmission.   

## 2023-08-08 ENCOUNTER — Ambulatory Visit (INDEPENDENT_AMBULATORY_CARE_PROVIDER_SITE_OTHER): Payer: Medicare Other

## 2023-08-08 DIAGNOSIS — I442 Atrioventricular block, complete: Secondary | ICD-10-CM | POA: Diagnosis not present

## 2023-08-09 LAB — CUP PACEART REMOTE DEVICE CHECK
Battery Remaining Longevity: 24 mo
Battery Voltage: 2.93 V
Brady Statistic AP VP Percent: 69.35 %
Brady Statistic AP VS Percent: 0 %
Brady Statistic AS VP Percent: 30.65 %
Brady Statistic AS VS Percent: 0 %
Brady Statistic RA Percent Paced: 69.35 %
Brady Statistic RV Percent Paced: 100 %
Date Time Interrogation Session: 20241023131140
Implantable Lead Connection Status: 753985
Implantable Lead Connection Status: 753985
Implantable Lead Implant Date: 20161207
Implantable Lead Implant Date: 20161207
Implantable Lead Location: 753859
Implantable Lead Location: 753860
Implantable Lead Model: 5076
Implantable Lead Model: 5076
Implantable Pulse Generator Implant Date: 20161207
Lead Channel Impedance Value: 323 Ohm
Lead Channel Impedance Value: 418 Ohm
Lead Channel Impedance Value: 418 Ohm
Lead Channel Impedance Value: 494 Ohm
Lead Channel Pacing Threshold Amplitude: 0.625 V
Lead Channel Pacing Threshold Amplitude: 0.75 V
Lead Channel Pacing Threshold Pulse Width: 0.4 ms
Lead Channel Pacing Threshold Pulse Width: 0.4 ms
Lead Channel Sensing Intrinsic Amplitude: 15.375 mV
Lead Channel Sensing Intrinsic Amplitude: 15.375 mV
Lead Channel Sensing Intrinsic Amplitude: 2.5 mV
Lead Channel Sensing Intrinsic Amplitude: 2.5 mV
Lead Channel Setting Pacing Amplitude: 2 V
Lead Channel Setting Pacing Amplitude: 2.5 V
Lead Channel Setting Pacing Pulse Width: 0.4 ms
Lead Channel Setting Sensing Sensitivity: 2.8 mV
Zone Setting Status: 755011
Zone Setting Status: 755011

## 2023-08-25 NOTE — Progress Notes (Signed)
Remote pacemaker transmission.   

## 2023-11-07 ENCOUNTER — Ambulatory Visit: Payer: Medicare Other

## 2023-11-08 ENCOUNTER — Telehealth: Payer: Self-pay

## 2023-11-08 NOTE — Telephone Encounter (Signed)
Received voice mail message from patient stating his monitor is not working and requested a call back.  Sent to device clinic triage for follow up (patient is not enrolled in [facility] clinic for monthly checks).

## 2023-11-08 NOTE — Telephone Encounter (Signed)
LMOVM returning pt call. Left device clinic number for pt to return call.

## 2023-11-09 NOTE — Telephone Encounter (Signed)
It looks like you called this pt. °

## 2023-11-09 NOTE — Telephone Encounter (Signed)
Pt returning call

## 2023-11-09 NOTE — Telephone Encounter (Signed)
Patient is returning call. Jacki Cones states this is for device and not her. Please advise

## 2023-11-13 NOTE — Telephone Encounter (Signed)
Transmission received 11/13/2023

## 2024-02-06 ENCOUNTER — Ambulatory Visit: Payer: Medicare Other

## 2024-02-06 DIAGNOSIS — I442 Atrioventricular block, complete: Secondary | ICD-10-CM | POA: Diagnosis not present

## 2024-02-09 LAB — CUP PACEART REMOTE DEVICE CHECK
Battery Remaining Longevity: 19 mo
Battery Voltage: 2.91 V
Brady Statistic AP VP Percent: 68.44 %
Brady Statistic AP VS Percent: 0 %
Brady Statistic AS VP Percent: 31.55 %
Brady Statistic AS VS Percent: 0 %
Brady Statistic RA Percent Paced: 68.44 %
Brady Statistic RV Percent Paced: 99.99 %
Date Time Interrogation Session: 20250424105545
Implantable Lead Connection Status: 753985
Implantable Lead Connection Status: 753985
Implantable Lead Implant Date: 20161207
Implantable Lead Implant Date: 20161207
Implantable Lead Location: 753859
Implantable Lead Location: 753860
Implantable Lead Model: 5076
Implantable Lead Model: 5076
Implantable Pulse Generator Implant Date: 20161207
Lead Channel Impedance Value: 323 Ohm
Lead Channel Impedance Value: 399 Ohm
Lead Channel Impedance Value: 418 Ohm
Lead Channel Impedance Value: 475 Ohm
Lead Channel Pacing Threshold Amplitude: 0.75 V
Lead Channel Pacing Threshold Amplitude: 0.75 V
Lead Channel Pacing Threshold Pulse Width: 0.4 ms
Lead Channel Pacing Threshold Pulse Width: 0.4 ms
Lead Channel Sensing Intrinsic Amplitude: 16.875 mV
Lead Channel Sensing Intrinsic Amplitude: 16.875 mV
Lead Channel Sensing Intrinsic Amplitude: 2.625 mV
Lead Channel Sensing Intrinsic Amplitude: 2.625 mV
Lead Channel Setting Pacing Amplitude: 2 V
Lead Channel Setting Pacing Amplitude: 2.5 V
Lead Channel Setting Pacing Pulse Width: 0.4 ms
Lead Channel Setting Sensing Sensitivity: 2.8 mV
Zone Setting Status: 755011
Zone Setting Status: 755011

## 2024-02-18 ENCOUNTER — Encounter: Payer: Self-pay | Admitting: Cardiology

## 2024-03-21 NOTE — Progress Notes (Signed)
 Remote pacemaker transmission.

## 2024-03-21 NOTE — Addendum Note (Signed)
 Addended by: Lott Rouleau A on: 03/21/2024 04:06 PM   Modules accepted: Orders

## 2024-05-07 ENCOUNTER — Ambulatory Visit: Payer: Medicare Other

## 2024-05-07 DIAGNOSIS — I442 Atrioventricular block, complete: Secondary | ICD-10-CM | POA: Diagnosis not present

## 2024-05-13 LAB — CUP PACEART REMOTE DEVICE CHECK
Battery Remaining Longevity: 16 mo
Battery Voltage: 2.91 V
Brady Statistic AP VP Percent: 62.41 %
Brady Statistic AP VS Percent: 0 %
Brady Statistic AS VP Percent: 37.58 %
Brady Statistic AS VS Percent: 0.01 %
Brady Statistic RA Percent Paced: 62.4 %
Brady Statistic RV Percent Paced: 99.96 %
Date Time Interrogation Session: 20250725091750
Implantable Lead Connection Status: 753985
Implantable Lead Connection Status: 753985
Implantable Lead Implant Date: 20161207
Implantable Lead Implant Date: 20161207
Implantable Lead Location: 753859
Implantable Lead Location: 753860
Implantable Lead Model: 5076
Implantable Lead Model: 5076
Implantable Pulse Generator Implant Date: 20161207
Lead Channel Impedance Value: 323 Ohm
Lead Channel Impedance Value: 399 Ohm
Lead Channel Impedance Value: 399 Ohm
Lead Channel Impedance Value: 456 Ohm
Lead Channel Pacing Threshold Amplitude: 0.75 V
Lead Channel Pacing Threshold Amplitude: 0.875 V
Lead Channel Pacing Threshold Pulse Width: 0.4 ms
Lead Channel Pacing Threshold Pulse Width: 0.4 ms
Lead Channel Sensing Intrinsic Amplitude: 16.875 mV
Lead Channel Sensing Intrinsic Amplitude: 16.875 mV
Lead Channel Sensing Intrinsic Amplitude: 2.375 mV
Lead Channel Sensing Intrinsic Amplitude: 2.375 mV
Lead Channel Setting Pacing Amplitude: 1.5 V
Lead Channel Setting Pacing Amplitude: 2 V
Lead Channel Setting Pacing Pulse Width: 0.4 ms
Lead Channel Setting Sensing Sensitivity: 2.8 mV
Zone Setting Status: 755011
Zone Setting Status: 755011

## 2024-05-16 ENCOUNTER — Ambulatory Visit: Payer: Self-pay | Admitting: Cardiology

## 2024-07-19 NOTE — Progress Notes (Signed)
 Remote PPM Transmission

## 2024-08-06 ENCOUNTER — Ambulatory Visit: Payer: Medicare Other

## 2024-11-05 ENCOUNTER — Ambulatory Visit: Payer: Medicare Other
